# Patient Record
Sex: Female | Born: 1965 | Race: Black or African American | Hispanic: No | Marital: Single | Smoking: Former smoker
Health system: Southern US, Community
[De-identification: ages and names within clinical notes are randomized; demographics above are authoritative.]

## PROBLEM LIST (undated history)

## (undated) DIAGNOSIS — Z972 Presence of dental prosthetic device (complete) (partial): Secondary | ICD-10-CM

## (undated) DIAGNOSIS — M719 Bursopathy, unspecified: Secondary | ICD-10-CM

---

## 2012-03-14 HISTORY — PX: OTHER SURGICAL HISTORY: SHX169

## 2014-07-23 DIAGNOSIS — Z87891 Personal history of nicotine dependence: Secondary | ICD-10-CM | POA: Insufficient documentation

## 2014-08-13 DIAGNOSIS — K635 Polyp of colon: Secondary | ICD-10-CM | POA: Insufficient documentation

## 2019-10-03 ENCOUNTER — Ambulatory Visit (INDEPENDENT_AMBULATORY_CARE_PROVIDER_SITE_OTHER): Payer: 59 | Admitting: Family Medicine

## 2019-10-03 ENCOUNTER — Encounter: Payer: Self-pay | Admitting: Family Medicine

## 2019-10-03 ENCOUNTER — Other Ambulatory Visit: Payer: Self-pay

## 2019-10-03 VITALS — BP 108/76 | HR 79 | Ht 66.0 in | Wt 167.0 lb

## 2019-10-03 DIAGNOSIS — Z124 Encounter for screening for malignant neoplasm of cervix: Secondary | ICD-10-CM | POA: Diagnosis not present

## 2019-10-03 DIAGNOSIS — Z7689 Persons encountering health services in other specified circumstances: Secondary | ICD-10-CM | POA: Diagnosis not present

## 2019-10-03 NOTE — Progress Notes (Signed)
Date:  10/03/2019   Name:  Beverly Farrell   DOB:  17-Jul-1965   MRN:  [id number]   Chief Complaint: Establish Care  Patient is a 54 year old female who presents for a comprehensive physical exam. The patient reports the following problems: gyn. Health maintenance has been reviewed    No results found for: CREATININE, BUN, NA, K, CL, CO2 No results found for: CHOL, HDL, LDLCALC, LDLDIRECT, TRIG, CHOLHDL No results found for: TSH No results found for: HGBA1C No results found for: WBC, HGB, HCT, MCV, PLT No results found for: ALT, AST, GGT, ALKPHOS, BILITOT   Review of Systems  Constitutional: Negative.  Negative for chills, fatigue, fever and unexpected weight change.  HENT: Negative for congestion, ear discharge, ear pain, mouth sores, rhinorrhea, sinus pressure, sneezing and sore throat.   Eyes: Negative for photophobia, pain, discharge, redness and itching.  Respiratory: Negative for cough, shortness of breath, wheezing and stridor.   Cardiovascular: Negative for chest pain, palpitations and leg swelling.  Gastrointestinal: Negative for abdominal pain, blood in stool, constipation, diarrhea, nausea and vomiting.  Endocrine: Negative for cold intolerance, heat intolerance, polydipsia, polyphagia and polyuria.  Genitourinary: Negative for dysuria, flank pain, frequency, hematuria, menstrual problem, pelvic pain, urgency, vaginal bleeding and vaginal discharge.  Musculoskeletal: Negative for arthralgias, back pain and myalgias.  Skin: Negative for rash.  Allergic/Immunologic: Negative for environmental allergies and food allergies.  Neurological: Negative for dizziness, weakness, light-headedness, numbness and headaches.  Hematological: Negative for adenopathy. Does not bruise/bleed easily.  Psychiatric/Behavioral: Negative for dysphoric mood. The patient is not nervous/anxious.     There are no problems to display for this patient.   No Known Allergies  Past Surgical History:   Procedure Laterality Date  . polyp removal  2014   removed 3     Social History   Tobacco Use  . Smoking status: Never Smoker  . Smokeless tobacco: Never Used  Vaping Use  . Vaping Use: Never used  Substance Use Topics  . Alcohol use: Yes    Comment: occasionally  . Drug use: Yes    Types: Marijuana     Medication list has been reviewed and updated.  No outpatient medications have been marked as taking for the 10/03/19 encounter (Office Visit) with Juline Patch, MD.    [facility] 2/9 Scores 10/03/2019  PHQ - 2 Score 0  PHQ- 9 Score 0    GAD 7 : Generalized Anxiety Score 10/03/2019  Nervous, Anxious, on Edge 0  Control/stop worrying 0  Worry too much - different things 0  Trouble relaxing 0  Restless 0  Easily annoyed or irritable 0  Afraid - awful might happen 0  Total GAD 7 Score 0  Anxiety Difficulty Not difficult at all    BP Readings from Last 3 Encounters:  10/03/19 108/76    Physical Exam Vitals and nursing note reviewed.  Constitutional:      Appearance: She is well-developed.  HENT:     Head: Normocephalic.     Right Ear: Tympanic membrane, ear canal and external ear normal.     Left Ear: Tympanic membrane, ear canal and external ear normal.     Nose: Nose normal.     Mouth/Throat:     Mouth: Mucous membranes are moist.  Eyes:     General: Lids are everted, no foreign bodies appreciated. No scleral icterus.       Left eye: No foreign body or hordeolum.     Conjunctiva/sclera: Conjunctivae  normal.     Right eye: Right conjunctiva is not injected.     Left eye: Left conjunctiva is not injected.     Pupils: Pupils are equal, round, and reactive to light.  Neck:     Thyroid: No thyromegaly.     Vascular: No JVD.     Trachea: No tracheal deviation.  Cardiovascular:     Rate and Rhythm: Normal rate and regular rhythm.     Heart sounds: Normal heart sounds. No murmur heard.  No friction rub. No gallop.   Pulmonary:     Effort: Pulmonary effort is  normal. No respiratory distress.     Breath sounds: Normal breath sounds. No wheezing, rhonchi or rales.  Abdominal:     General: Bowel sounds are normal.     Palpations: Abdomen is soft. There is no mass.     Tenderness: There is no abdominal tenderness. There is no guarding or rebound.  Musculoskeletal:        General: No tenderness. Normal range of motion.     Cervical back: Normal range of motion and neck supple.  Lymphadenopathy:     Cervical: No cervical adenopathy.  Skin:    General: Skin is warm.     Findings: No rash.  Neurological:     Mental Status: She is alert and oriented to person, place, and time.     Cranial Nerves: No cranial nerve deficit.     Deep Tendon Reflexes: Reflexes normal.  Psychiatric:        Mood and Affect: Mood is not anxious or depressed.     Wt Readings from Last 3 Encounters:  10/03/19 167 lb (75.8 kg)    BP 108/76   Pulse 79   Ht 5\' 6"  (1.676 m)   Wt 167 lb (75.8 kg)   SpO2 97%   BMI 26.95 kg/m   Assessment and Plan: 1. Establishing care with new doctor, encounter for Patient establishes care with new physician.  Previous encounters were reviewed as were most recent labs, most recent imaging, and care everywhere.  Patient will be returning in 6 to 8 weeks health maintenance at which time we will do some lab work.  2. Encounter for screening for cervical cancer  Patient has a history of uterine polyps that have been [place] [facility].  Will refer to [facility] for cervical cancer maintenance and scheduling of her mammography. - Ambulatory referral to Obstetrics / Gynecology

## 2019-10-07 ENCOUNTER — Telehealth: Payer: Self-pay | Admitting: Obstetrics and Gynecology

## 2019-10-07 NOTE — Telephone Encounter (Signed)
[facility] referring for Encounter for screening for cervical cancer . Called and left voicemail for patient to call back to be scheduled.

## 2019-10-16 ENCOUNTER — Other Ambulatory Visit (HOSPITAL_COMMUNITY)
Admission: RE | Admit: 2019-10-16 | Discharge: 2019-10-16 | Disposition: A | Discharge status: Home / Self Care | Payer: 59 | Source: Ambulatory Visit | Attending: Advanced Practice Midwife | Admitting: Advanced Practice Midwife

## 2019-10-16 ENCOUNTER — Other Ambulatory Visit: Payer: Self-pay

## 2019-10-16 ENCOUNTER — Encounter: Payer: Self-pay | Admitting: Advanced Practice Midwife

## 2019-10-16 ENCOUNTER — Ambulatory Visit (INDEPENDENT_AMBULATORY_CARE_PROVIDER_SITE_OTHER): Payer: 59 | Admitting: Advanced Practice Midwife

## 2019-10-16 VITALS — BP 131/83 | HR 82 | Ht 66.0 in | Wt 167.0 lb

## 2019-10-16 DIAGNOSIS — Z1239 Encounter for other screening for malignant neoplasm of breast: Secondary | ICD-10-CM | POA: Diagnosis not present

## 2019-10-16 DIAGNOSIS — Z124 Encounter for screening for malignant neoplasm of cervix: Secondary | ICD-10-CM

## 2019-10-16 DIAGNOSIS — Z87898 Personal history of other specified conditions: Secondary | ICD-10-CM

## 2019-10-16 DIAGNOSIS — Z113 Encounter for screening for infections with a predominantly sexual mode of transmission: Secondary | ICD-10-CM

## 2019-10-16 DIAGNOSIS — Z1211 Encounter for screening for malignant neoplasm of colon: Secondary | ICD-10-CM

## 2019-10-16 NOTE — Progress Notes (Signed)
   Patient ID: Beverly Farrell, female   DOB: [birth year], 54 y.o.   MRN: [id number]  Reason for Consult: Gynecologic Exam (Referred by [facility] for PAP/breast exam)   Referred by Juline Patch, MD  Subjective:  HPI:  Beverly Farrell is a 54 y.o. female referred by [facility] for PAP smear and breast exam/referral for mammogram. Patient reports last PAP was in 2016 and was normal. She has a history of abnormal PAP years ago with cryo procedure. She denies any gyn concerns or breast concerns. She accepts STD testing and is overdue for a colonoscopy and would like a referral for repeat.   History reviewed. No pertinent past medical history. Family History  Problem Relation Age of Onset  . Hypertension Mother   . Diabetes Father   . Hyperlipidemia Father   . Hypertension Father    Past Surgical History:  Procedure Laterality Date  . polyp removal  2014   removed 3     Short Social History:  Social History   Tobacco Use  . Smoking status: Former Research scientist (life sciences)  . Smokeless tobacco: Never Used  Substance Use Topics  . Alcohol use: Yes    Comment: occasionally    No Known Allergies  No current outpatient medications on file.   No current facility-administered medications for this visit.    Review of Systems  Constitutional: Negative for chills and fever.  HENT: Negative for congestion, ear discharge, ear pain, hearing loss, sinus pain and sore throat.   Eyes: Negative for blurred vision and double vision.  Respiratory: Negative for cough, shortness of breath and wheezing.   Cardiovascular: Negative for chest pain, palpitations and leg swelling.  Gastrointestinal: Negative for abdominal pain, blood in stool, constipation, diarrhea, heartburn, melena, nausea and vomiting.  Genitourinary: Negative for dysuria, flank pain, frequency, hematuria and urgency.  Musculoskeletal: Negative for back pain, joint pain and myalgias.  Skin: Negative for itching and rash.  Neurological:  Negative for dizziness, tingling, tremors, sensory change, speech change, focal weakness, seizures, loss of consciousness, weakness and headaches.  Endo/Heme/Allergies: Negative for environmental allergies. Does not bruise/bleed easily.  Psychiatric/Behavioral: Negative for depression, hallucinations, memory loss, substance abuse and suicidal ideas. The patient is not nervous/anxious and does not have insomnia.         Objective:  Objective   Vitals:   10/16/19 1525  BP: 131/83  Pulse: 82  Weight: 167 lb (75.8 kg)  Height: 5\' 6"  (1.676 m)   Body mass index is 26.95 kg/m. Constitutional: Well nourished, well developed female in no acute distress.  HEENT: normal Skin: Warm and dry.  Breast: symmetrical, non-tender to palpation, no masses or lesions Respiratory:  Normal respiratory effort Neuro: DTRs 2+, Cranial nerves grossly intact Psych: Alert and Oriented x3. No memory deficits. Normal mood and affect.  MS: normal gait, normal bilateral lower extremity ROM/strength/stability.  Pelvic exam:  is not limited by body habitus EGBUS: within normal limits Vagina: within normal limits and with normal mucosa  Cervix: normal nullip appearance, no CMT, no bleeding   Assessment/Plan:    54 y.o. G0 P0 female PAP smear and breast exam   Cervical cancer screening Referral to [facility] for mammogram Referral to [facility] for colonoscopy   [facility] 10/16/2019, 4:25 PM

## 2019-10-17 LAB — HEPATITIS PANEL, ACUTE
Hep A IgM: NEGATIVE
Hep B C IgM: NEGATIVE
Hep C Virus Ab: <0.1 s/co ratio (ref 0.0–0.9)
Hepatitis B Surface Ag: NEGATIVE

## 2019-10-17 LAB — HIV ANTIBODY (ROUTINE TESTING W REFLEX): HIV Screen 4th Generation wRfx: NONREACTIVE

## 2019-10-17 LAB — RPR QUALITATIVE: RPR Ser Ql: NONREACTIVE

## 2019-10-18 ENCOUNTER — Telehealth (INDEPENDENT_AMBULATORY_CARE_PROVIDER_SITE_OTHER): Payer: Self-pay | Admitting: Gastroenterology

## 2019-10-18 ENCOUNTER — Other Ambulatory Visit: Payer: Self-pay

## 2019-10-18 DIAGNOSIS — Z1211 Encounter for screening for malignant neoplasm of colon: Secondary | ICD-10-CM

## 2019-10-18 LAB — CYTOLOGY - PAP
Chlamydia: NEGATIVE
Comment: NEGATIVE
Comment: NEGATIVE
Comment: NEGATIVE
Comment: NORMAL
Diagnosis: NEGATIVE
High risk HPV: NEGATIVE
Neisseria Gonorrhea: NEGATIVE
Trichomonas: NEGATIVE

## 2019-10-18 MED ORDER — NA SULFATE-K SULFATE-MG SULF 17.5-3.13-1.6 GM/177ML PO SOLN: 1.0000 | Freq: Once | ORAL | 0 refills | Status: AC

## 2019-10-18 NOTE — Progress Notes (Signed)
Gastroenterology Pre-Procedure Review  Request Date: Friday 11/08/19 Requesting Physician: Dr. Allen Norris  PATIENT REVIEW QUESTIONS: The patient responded to the following health history questions as indicated:    1. Are you having any GI issues? no 2. Do you have a personal history of Polyps? yes (patient states she had colonoscopy 5 years ago at [facility] with Dr. Bobbye Charleston polyps she said polyps were present) 3. Do you have a family history of Colon Cancer or Polyps? no 4. Diabetes Mellitus? no 5. Joint replacements in the past 12 months?no 6. Major health problems in the past 3 months?no 7. Any artificial heart valves, MVP, or defibrillator?no    MEDICATIONS & ALLERGIES:    Patient reports the following regarding taking any anticoagulation/antiplatelet therapy:   Plavix, Coumadin, Eliquis, Xarelto, Lovenox, Pradaxa, Brilinta, or Effient? no Aspirin? no  Patient confirms/reports the following medications:  No current outpatient medications on file.   No current facility-administered medications for this visit.    Patient confirms/reports the following allergies:  No Known Allergies  No orders of the defined types were placed in this encounter.   AUTHORIZATION INFORMATION Primary Insurance: 1D#: Group #:  Secondary Insurance: 1D#: Group #:  SCHEDULE INFORMATION: Date: Friday 08/27/21Time: Location:[facility]

## 2019-10-30 ENCOUNTER — Encounter: Payer: Self-pay | Admitting: Gastroenterology

## 2019-10-30 ENCOUNTER — Other Ambulatory Visit: Payer: Self-pay

## 2019-10-31 ENCOUNTER — Encounter: Payer: Self-pay | Admitting: Family Medicine

## 2019-10-31 ENCOUNTER — Other Ambulatory Visit: Payer: Self-pay

## 2019-10-31 ENCOUNTER — Ambulatory Visit (INDEPENDENT_AMBULATORY_CARE_PROVIDER_SITE_OTHER): Payer: 59 | Admitting: Family Medicine

## 2019-10-31 VITALS — BP 136/80 | HR 71 | Ht 66.0 in | Wt 165.0 lb

## 2019-10-31 DIAGNOSIS — Z0000 Encounter for general adult medical examination without abnormal findings: Secondary | ICD-10-CM | POA: Diagnosis not present

## 2019-10-31 DIAGNOSIS — Z8639 Personal history of other endocrine, nutritional and metabolic disease: Secondary | ICD-10-CM

## 2019-10-31 NOTE — Progress Notes (Signed)
Date:  10/31/2019   Name:  Beverly Farrell   DOB:  Sep 22, 1965   MRN:  [id number]   Chief Complaint: Hyperlipidemia (H/o of hyperlipidemia in her 76s. )  Patient is a 54 year old female who presents for a health maintenance exam. The patient reports the following problems: history of hyperlipid. Health maintenance has been reviewed up to date  Hyperlipidemia This is a chronic problem. The current episode started more than 1 year ago. The problem is controlled. Recent lipid tests were reviewed and are normal. She has no history of chronic renal disease, diabetes, hypothyroidism, liver disease, obesity or nephrotic syndrome. There are no known factors aggravating her hyperlipidemia. Pertinent negatives include no chest pain, focal sensory loss, focal weakness, leg pain, myalgias or shortness of breath. Current antihyperlipidemic treatment includes statins and diet change. The current treatment provides moderate improvement of lipids. There are no compliance problems.     No results found for: CREATININE, BUN, NA, K, CL, CO2 No results found for: CHOL, HDL, LDLCALC, LDLDIRECT, TRIG, CHOLHDL No results found for: TSH No results found for: HGBA1C No results found for: WBC, HGB, HCT, MCV, PLT No results found for: ALT, AST, GGT, ALKPHOS, BILITOT   Review of Systems  Constitutional: Negative.  Negative for chills, fatigue, fever and unexpected weight change.  HENT: Negative for congestion, ear discharge, ear pain, rhinorrhea, sinus pressure, sneezing and sore throat.   Eyes: Negative for photophobia, pain, discharge, redness and itching.  Respiratory: Negative for cough, shortness of breath, wheezing and stridor.   Cardiovascular: Negative for chest pain.  Gastrointestinal: Negative for abdominal pain, blood in stool, constipation, diarrhea, nausea and vomiting.  Endocrine: Negative for cold intolerance, heat intolerance, polydipsia, polyphagia and polyuria.  Genitourinary: Negative for  dysuria, flank pain, frequency, hematuria, menstrual problem, pelvic pain, urgency, vaginal bleeding and vaginal discharge.  Musculoskeletal: Negative for arthralgias, back pain and myalgias.  Skin: Negative for rash.  Allergic/Immunologic: Negative for environmental allergies and food allergies.  Neurological: Negative for dizziness, focal weakness, weakness, light-headedness, numbness and headaches.  Hematological: Negative for adenopathy. Does not bruise/bleed easily.  Psychiatric/Behavioral: Negative for dysphoric mood. The patient is not nervous/anxious.     Patient Active Problem List   Diagnosis Date Noted  . History of abnormal mammogram 10/16/2019  . Colon polyps 08/13/2014  . History of smoking 07/23/2014    No Known Allergies  Past Surgical History:  Procedure Laterality Date  . polyp removal  2014   removed 3     Social History   Tobacco Use  . Smoking status: Former Smoker    Packs/day: 1.00    Years: 1.00    Pack years: 1.00    Types: Cigarettes  . Smokeless tobacco: Never Used  Vaping Use  . Vaping Use: Never used  Substance Use Topics  . Alcohol use: Yes    Comment: occasionally  . Drug use: Yes    Frequency: 2.0 times per week    Types: Marijuana     Medication list has been reviewed and updated.  No outpatient medications have been marked as taking for the 10/31/19 encounter (Office Visit) with Juline Patch, MD.    [facility] 2/9 Scores 10/03/2019  PHQ - 2 Score 0  PHQ- 9 Score 0    GAD 7 : Generalized Anxiety Score 10/03/2019  Nervous, Anxious, on Edge 0  Control/stop worrying 0  Worry too much - different things 0  Trouble relaxing 0  Restless 0  Easily annoyed or irritable  0  Afraid - awful might happen 0  Total GAD 7 Score 0  Anxiety Difficulty Not difficult at all    BP Readings from Last 3 Encounters:  10/31/19 136/80  10/16/19 131/83  10/03/19 108/76    Physical Exam  Wt Readings from Last 3 Encounters:  10/31/19 165 lb  (74.8 kg)  10/16/19 167 lb (75.8 kg)  10/03/19 167 lb (75.8 kg)    BP 136/80   Pulse 71   Ht 5\' 6"  (1.676 m)   Wt 165 lb (74.8 kg)   SpO2 100%   BMI 26.63 kg/m   Assessment and Plan:  1. Healthcare maintenance No subjective objective concerns noted during history and physical exam.  Review of patient's chart notes that she has had a history of hyperlipidemia controlled with dietary approach in the past.  We will check renal function panel for glucose and electrolyte concerns and GFR as well as lipid panel.- Renal Function Panel - Lipid Panel With LDL/HDL Ratio  2. History of hyperlipidemia Chronic.  Controlled.  Stable.  We will check lipid panel. - Renal Function Panel - Lipid Panel With LDL/HDL Ratio

## 2019-11-01 LAB — LIPID PANEL WITH LDL/HDL RATIO
Cholesterol, Total: 242 mg/dL — ABNORMAL HIGH (ref 100–199)
HDL: 81 mg/dL (ref >39)
LDL Chol Calc (NIH): 149 mg/dL — ABNORMAL HIGH (ref 0–99)
LDL/HDL Ratio: 1.8 ratio (ref 0.0–3.2)
Triglycerides: 69 mg/dL (ref 0–149)
VLDL Cholesterol Cal: 12 mg/dL (ref 5–40)

## 2019-11-01 LAB — RENAL FUNCTION PANEL
Albumin: 4.5 g/dL (ref 3.8–4.9)
BUN/Creatinine Ratio: 21 (ref 9–23)
BUN: 14 mg/dL (ref 6–24)
CO2: 24 mmol/L (ref 20–29)
Calcium: 9.9 mg/dL (ref 8.7–10.2)
Chloride: 103 mmol/L (ref 96–106)
Creatinine, Ser: 0.66 mg/dL (ref 0.57–1.00)
GFR calc Af Amer: 115 mL/min/{1.73_m2} (ref >59)
GFR calc non Af Amer: 100 mL/min/{1.73_m2} (ref >59)
Glucose: 99 mg/dL (ref 65–99)
Phosphorus: 4.1 mg/dL (ref 3.0–4.3)
Potassium: 4.9 mmol/L (ref 3.5–5.2)
Sodium: 140 mmol/L (ref 134–144)

## 2019-11-06 ENCOUNTER — Other Ambulatory Visit: Payer: Self-pay

## 2019-11-06 ENCOUNTER — Other Ambulatory Visit
Admission: RE | Admit: 2019-11-06 | Discharge: 2019-11-06 | Disposition: A | Discharge status: Home / Self Care | Payer: 59 | Source: Ambulatory Visit | Attending: Gastroenterology | Admitting: Gastroenterology

## 2019-11-06 DIAGNOSIS — Z01812 Encounter for preprocedural laboratory examination: Secondary | ICD-10-CM | POA: Diagnosis present

## 2019-11-06 DIAGNOSIS — Z20822 Contact with and (suspected) exposure to covid-19: Secondary | ICD-10-CM | POA: Diagnosis not present

## 2019-11-06 NOTE — Discharge Instructions (Signed)
General Anesthesia, Adult, Care After This sheet gives you information about how to care for yourself after your procedure. Your health care provider may also give you more specific instructions. If you have problems or questions, contact your health care provider. What can I expect after the procedure? After the procedure, the following side effects are common:  Pain or discomfort at the IV site.  Nausea.  Vomiting.  Sore throat.  Trouble concentrating.  Feeling cold or chills.  Weak or tired.  Sleepiness and fatigue.  Soreness and body aches. These side effects can affect parts of the body that were not involved in surgery. Follow these instructions at home:  For at least 24 hours after the procedure:  Have a responsible adult stay with you. It is important to have someone help care for you until you are awake and alert.  Rest as needed.  Do not: ? Participate in activities in which you could fall or become injured. ? Drive. ? Use heavy machinery. ? Drink alcohol. ? Take sleeping pills or medicines that cause drowsiness. ? Make important decisions or sign legal documents. ? Take care of children on your own. Eating and drinking  Follow any instructions from your health care provider about eating or drinking restrictions.  When you feel hungry, start by eating small amounts of foods that are soft and easy to digest (bland), such as toast. Gradually return to your regular diet.  Drink enough fluid to keep your urine pale yellow.  If you vomit, rehydrate by drinking water, juice, or clear broth. General instructions  If you have sleep apnea, surgery and certain medicines can increase your risk for breathing problems. Follow instructions from your health care provider about wearing your sleep device: ? Anytime you are sleeping, including during daytime naps. ? While taking prescription pain medicines, sleeping medicines, or medicines that make you drowsy.  Return to  your normal activities as told by your health care provider. Ask your health care provider what activities are safe for you.  Take over-the-counter and prescription medicines only as told by your health care provider.  If you smoke, do not smoke without supervision.  Keep all follow-up visits as told by your health care provider. This is important. Contact a health care provider if:  You have nausea or vomiting that does not get better with medicine.  You cannot eat or drink without vomiting.  You have pain that does not get better with medicine.  You are unable to pass urine.  You develop a skin rash.  You have a fever.  You have redness around your IV site that gets worse. Get help right away if:  You have difficulty breathing.  You have chest pain.  You have blood in your urine or stool, or you vomit blood. Summary  After the procedure, it is common to have a sore throat or nausea. It is also common to feel tired.  Have a responsible adult stay with you for the first 24 hours after general anesthesia. It is important to have someone help care for you until you are awake and alert.  When you feel hungry, start by eating small amounts of foods that are soft and easy to digest (bland), such as toast. Gradually return to your regular diet.  Drink enough fluid to keep your urine pale yellow.  Return to your normal activities as told by your health care provider. Ask your health care provider what activities are safe for you. This information is not   intended to replace advice given to you by your health care provider. Make sure you discuss any questions you have with your health care provider. Document Revised: 03/03/2017 Document Reviewed: 10/14/2016 Elsevier Patient Education  2020 [employer]  

## 2019-11-07 LAB — SARS CORONAVIRUS 2 (TAT 6-24 HRS): SARS Coronavirus 2: NEGATIVE

## 2019-11-08 ENCOUNTER — Encounter: Payer: Self-pay | Admitting: Gastroenterology

## 2019-11-08 ENCOUNTER — Ambulatory Visit: Payer: 59 | Admitting: Anesthesiology

## 2019-11-08 ENCOUNTER — Ambulatory Visit
Admission: RE | Admit: 2019-11-08 | Discharge: 2019-11-08 | Disposition: A | Discharge status: Home / Self Care | Payer: 59 | Attending: Gastroenterology | Admitting: Gastroenterology

## 2019-11-08 ENCOUNTER — Encounter
Admission: RE | Disposition: A | Discharge status: Home / Self Care | Payer: Self-pay | Source: Home / Self Care | Attending: Gastroenterology

## 2019-11-08 ENCOUNTER — Other Ambulatory Visit: Payer: Self-pay

## 2019-11-08 DIAGNOSIS — K635 Polyp of colon: Secondary | ICD-10-CM

## 2019-11-08 DIAGNOSIS — Z87891 Personal history of nicotine dependence: Secondary | ICD-10-CM | POA: Insufficient documentation

## 2019-11-08 DIAGNOSIS — D124 Benign neoplasm of descending colon: Secondary | ICD-10-CM | POA: Diagnosis not present

## 2019-11-08 DIAGNOSIS — Z860100 Personal history of colon polyps, unspecified: Secondary | ICD-10-CM

## 2019-11-08 DIAGNOSIS — Z1211 Encounter for screening for malignant neoplasm of colon: Secondary | ICD-10-CM | POA: Diagnosis not present

## 2019-11-08 DIAGNOSIS — D123 Benign neoplasm of transverse colon: Secondary | ICD-10-CM | POA: Diagnosis not present

## 2019-11-08 DIAGNOSIS — K640 First degree hemorrhoids: Secondary | ICD-10-CM | POA: Insufficient documentation

## 2019-11-08 DIAGNOSIS — K5730 Diverticulosis of large intestine without perforation or abscess without bleeding: Secondary | ICD-10-CM | POA: Diagnosis not present

## 2019-11-08 HISTORY — PX: POLYPECTOMY: SHX5525

## 2019-11-08 HISTORY — DX: Presence of dental prosthetic device (complete) (partial): Z97.2

## 2019-11-08 HISTORY — DX: Bursopathy, unspecified: M71.9

## 2019-11-08 HISTORY — PX: COLONOSCOPY WITH PROPOFOL: SHX5780

## 2019-11-08 SURGERY — COLONOSCOPY WITH PROPOFOL
Anesthesia: General | Site: Rectum

## 2019-11-08 MED ORDER — PROPOFOL 10 MG/ML IV BOLUS
INTRAVENOUS | Status: DC | PRN
  Administered 2019-11-08: 120 mg via INTRAVENOUS
  Administered 2019-11-08: 40 mg via INTRAVENOUS
  Administered 2019-11-08: 20 mg via INTRAVENOUS
  Administered 2019-11-08: 10 mg via INTRAVENOUS
  Administered 2019-11-08 (×2): 20 mg via INTRAVENOUS

## 2019-11-08 MED ORDER — LIDOCAINE HCL (CARDIAC) PF 100 MG/5ML IV SOSY
PREFILLED_SYRINGE | INTRAVENOUS | Status: DC | PRN
  Administered 2019-11-08: 30 mg via INTRAVENOUS

## 2019-11-08 MED ORDER — STERILE WATER FOR IRRIGATION IR SOLN: Status: DC | PRN

## 2019-11-08 MED ORDER — LACTATED RINGERS IV SOLN: INTRAVENOUS | Status: DC

## 2019-11-08 MED ORDER — PHENYLEPHRINE HCL (PRESSORS) 10 MG/ML IV SOLN
INTRAVENOUS | Status: DC | PRN
  Administered 2019-11-08: 50 ug via INTRAVENOUS

## 2019-11-08 SURGICAL SUPPLY — 7 items
GOWN CVR UNV OPN BCK APRN NK (MISCELLANEOUS) ×2 IMPLANT
GOWN ISOL THUMB LOOP REG UNIV (MISCELLANEOUS) ×6
KIT ENDO PROCEDURE OLY (KITS) ×3 IMPLANT
MANIFOLD NEPTUNE II (INSTRUMENTS) ×3 IMPLANT
SNARE SHORT THROW 13M SML OVAL (MISCELLANEOUS) ×3 IMPLANT
TRAP ETRAP POLY (MISCELLANEOUS) ×3 IMPLANT
WATER STERILE IRR 250ML POUR (IV SOLUTION) ×3 IMPLANT

## 2019-11-08 NOTE — Anesthesia Preprocedure Evaluation (Signed)
Anesthesia Evaluation  Patient identified by MRN, date of birth, ID band Patient awake    Reviewed: Allergy & Precautions, NPO status , Patient's Chart, lab work & pertinent test results  Airway Mallampati: II  TM Distance: >3 FB Neck ROM: Full    Dental no notable dental hx.    Pulmonary neg pulmonary ROS, former smoker,    Pulmonary exam normal breath sounds clear to auscultation       Cardiovascular Exercise Tolerance: Good negative cardio ROS Normal cardiovascular exam Rhythm:Regular Rate:Normal     Neuro/Psych negative neurological ROS  negative psych ROS   GI/Hepatic negative GI ROS, Neg liver ROS,   Endo/Other  negative endocrine ROS  Renal/GU negative Renal ROS  negative genitourinary   Musculoskeletal negative musculoskeletal ROS (+)   Abdominal   Peds negative pediatric ROS (+)  Hematology negative hematology ROS (+)   Anesthesia Other Findings   Reproductive/Obstetrics negative OB ROS                             Anesthesia Physical Anesthesia Plan  ASA: II  Anesthesia Plan: General   Post-op Pain Management:    Induction: Intravenous  PONV Risk Score and Plan: 3 and Treatment may vary due to age or medical condition and Propofol infusion  Airway Management Planned: Nasal Cannula and Natural Airway  Additional Equipment:   Intra-op Plan:   Post-operative Plan:   Informed Consent: I have reviewed the patients History and Physical, chart, labs and discussed the procedure including the risks, benefits and alternatives for the proposed anesthesia with the patient or authorized representative who has indicated his/her understanding and acceptance.     Dental advisory given  Plan Discussed with: CRNA  Anesthesia Plan Comments:         Anesthesia Quick Evaluation  Patient Active Problem List   Diagnosis Date Noted  . History of abnormal mammogram  10/16/2019  . Colon polyps 08/13/2014  . History of smoking 07/23/2014    No flowsheet data found. BMP Latest Ref Rng & Units 10/31/2019  Glucose 65 - 99 mg/dL 99  BUN 6 - 24 mg/dL 14  Creatinine 0.57 - 1.00 mg/dL 0.66  BUN/Creat Ratio 9 - 23 21  Sodium 134 - 144 mmol/L 140  Potassium 3.5 - 5.2 mmol/L 4.9  Chloride 96 - 106 mmol/L 103  CO2 20 - 29 mmol/L 24  Calcium 8.7 - 10.2 mg/dL 9.9    Risks and benefits of anesthesia discussed at length, patient or surrogate demonstrates understanding. Appropriately NPO. Plan to proceed with anesthesia.  Champ Mungo, MD 11/08/19

## 2019-11-08 NOTE — Op Note (Signed)
[facility] Patient Name: Beverly Farrell Procedure Date: 11/08/2019 8:23 AM MRN: [id number] Account #: [id number] Date of Birth: 01/17/1966 Admit Type: Outpatient Age: 54 Room: [facility] OR ROOM 01 Gender: Female Note Status: Finalized Procedure:             Colonoscopy Indications:           High risk colon cancer surveillance: Personal history                         of colonic polyps Providers:             Lucilla Lame MD, MD Referring MD:          Juline Patch, MD (Referring MD) Medicines:             Propofol per Anesthesia Complications:         No immediate complications. Procedure:             Pre-Anesthesia Assessment:                        - Prior to the procedure, a History and Physical was                         performed, and patient medications and allergies were                         reviewed. The patient's tolerance of previous                         anesthesia was also reviewed. The risks and benefits                         of the procedure and the sedation options and risks                         were discussed with the patient. All questions were                         answered, and informed consent was obtained. Prior                         Anticoagulants: The patient has taken no previous                         anticoagulant or antiplatelet agents. ASA Grade                         Assessment: II - A patient with mild systemic disease.                         After reviewing the risks and benefits, the patient                         was deemed in satisfactory condition to undergo the                         procedure.  After obtaining informed consent, the colonoscope was                         passed under direct vision. Throughout the procedure,                         the patient's blood pressure, pulse, and oxygen                         saturations were monitored continuously. The was                          introduced through the anus and advanced to the the                         cecum, identified by appendiceal orifice and ileocecal                         valve. The colonoscopy was performed without                         difficulty. The patient tolerated the procedure well.                         The quality of the bowel preparation was excellent. Findings:      The perianal and digital rectal examinations were normal.      A 4 mm polyp was found in the transverse colon. The polyp was sessile.       The polyp was removed with a cold snare. Resection and retrieval were       complete.      A 4 mm polyp was found in the descending colon. The polyp was sessile.       The polyp was removed with a cold snare. Resection and retrieval were       complete.      Non-bleeding internal hemorrhoids were found during retroflexion. The       hemorrhoids were Grade I (internal hemorrhoids that do not prolapse).      A few small-mouthed diverticula were found in the entire colon. Impression:            - One 4 mm polyp in the transverse colon, removed with                         a cold snare. Resected and retrieved.                        - One 4 mm polyp in the descending colon, removed with                         a cold snare. Resected and retrieved.                        - Non-bleeding internal hemorrhoids.                        - Diverticulosis in the entire examined colon. Recommendation:        - Discharge patient to home.                        -  Resume previous diet.                        - Continue present medications.                        - Await pathology results.                        - Repeat colonoscopy in 7 years for surveillance. Procedure Code(s):     --- Professional ---                        [id number], Colonoscopy, flexible; with removal of                         tumor(s), polyp(s), or other lesion(s) by snare                         technique Diagnosis  Code(s):     --- Professional ---                        Z86.010, Personal history of colonic polyps                        K63.5, Polyp of colon CPT copyright 2019 American Medical Association. All rights reserved. The codes documented in this report are preliminary and upon coder review may  be revised to meet current compliance requirements. Lucilla Lame MD, MD 11/08/2019 8:48:31 AM This report has been signed electronically. Number of Addenda: 0 Note Initiated On: 11/08/2019 8:23 AM Scope Withdrawal Time: 0 hours 7 minutes 0 seconds  Total Procedure Duration: 0 hours 11 minutes 15 seconds  Estimated Blood Loss:  Estimated blood loss: none.      [facility]

## 2019-11-08 NOTE — Anesthesia Procedure Notes (Signed)
Date/Time: 11/08/2019 8:28 AM Performed by: Cameron Ali, CRNA Pre-anesthesia Checklist: Patient identified, Emergency Drugs available, Suction available, Timeout performed and Patient being monitored Patient Re-evaluated:Patient Re-evaluated prior to induction Oxygen Delivery Method: Nasal cannula Placement Confirmation: positive ETCO2

## 2019-11-08 NOTE — Anesthesia Postprocedure Evaluation (Signed)
Anesthesia Post Note  Patient: Beverly Farrell  Procedure(s) Performed: COLONOSCOPY WITH BIOPSY (N/A Rectum) POLYPECTOMY (N/A Rectum)     Patient location during evaluation: PACU Anesthesia Type: General Level of consciousness: awake and alert Pain management: pain level controlled Vital Signs Assessment: post-procedure vital signs reviewed and stable Respiratory status: spontaneous breathing, nonlabored ventilation, respiratory function stable and patient connected to nasal cannula oxygen Cardiovascular status: stable and blood pressure returned to baseline Postop Assessment: no apparent nausea or vomiting Anesthetic complications: no   No complications documented.  Sinda Du

## 2019-11-08 NOTE — Transfer of Care (Signed)
Immediate Anesthesia Transfer of Care Note  Patient: Beverly Farrell  Procedure(s) Performed: COLONOSCOPY WITH BIOPSY (N/A Rectum) POLYPECTOMY (N/A Rectum)  Patient Location: PACU  Anesthesia Type: General  Level of Consciousness: awake, alert  and patient cooperative  Airway and Oxygen Therapy: Patient Spontanous Breathing and Patient connected to supplemental oxygen  Post-op Assessment: Post-op Vital signs reviewed, Patient's Cardiovascular Status Stable, Respiratory Function Stable, Patent Airway and No signs of Nausea or vomiting  Post-op Vital Signs: Reviewed and stable  Complications: No complications documented.

## 2019-11-08 NOTE — H&P (Signed)
Lucilla Lame, MD [address] [place], [place] [address] [phone] Fax : [phone]  Primary Care Physician:  Juline Patch, MD Primary Gastroenterologist:  Dr. Allen Norris  Pre-Procedure History & Physical: HPI:  Beverly Farrell is a 54 y.o. female is here for an colonoscopy.   Past Medical History:  Diagnosis Date  . Bursitis    right hip  . Wears dentures    partial upper    Past Surgical History:  Procedure Laterality Date  . polyp removal  2014   removed 3     Prior to Admission medications   Not on File    Allergies as of 10/18/2019  . (No Known Allergies)    Family History  Problem Relation Age of Onset  . Hypertension Mother   . Diabetes Father   . Hyperlipidemia Father   . Hypertension Father     Social History   Socioeconomic History  . Marital status: Single    Spouse name: Not on file  . Number of children: Not on file  . Years of education: Not on file  . Highest education level: Not on file  Occupational History  . Not on file  Tobacco Use  . Smoking status: Former Smoker    Packs/day: 1.00    Years: 1.00    Pack years: 1.00    Types: Cigarettes  . Smokeless tobacco: Never Used  Vaping Use  . Vaping Use: Never used  Substance and Sexual Activity  . Alcohol use: Yes    Comment: occasionally  . Drug use: Yes    Frequency: 2.0 times per week    Types: Marijuana  . Sexual activity: Yes  Other Topics Concern  . Not on file  Social History Narrative  . Not on file   Social Determinants of Health   Financial Resource Strain:   . Difficulty of Paying Living Expenses: Not on file  Food Insecurity:   . Worried About Charity fundraiser in the Last Year: Not on file  . Ran Out of Food in the Last Year: Not on file  Transportation Needs:   . Lack of Transportation (Medical): Not on file  . Lack of Transportation (Non-Medical): Not on file  Physical Activity:   . Days of Exercise per Week: Not on file  .  Minutes of Exercise per Session: Not on file  Stress:   . Feeling of Stress : Not on file  Social Connections:   . Frequency of Communication with Friends and Family: Not on file  . Frequency of Social Gatherings with Friends and Family: Not on file  . Attends Religious Services: Not on file  . Active Member of Clubs or Organizations: Not on file  . Attends Archivist Meetings: Not on file  . Marital Status: Not on file  Intimate Partner Violence:   . Fear of Current or Ex-Partner: Not on file  . Emotionally Abused: Not on file  . Physically Abused: Not on file  . Sexually Abused: Not on file    Review of Systems: See HPI, otherwise negative ROS  Physical Exam: Ht 5\' 6"  (1.676 m)   Wt 74.8 kg   BMI 26.63 kg/m  General:   Alert,  pleasant and cooperative in NAD Head:  Normocephalic and atraumatic. Neck:  Supple; no masses or thyromegaly. Lungs:  Clear throughout to auscultation.    Heart:  Regular rate and rhythm. Abdomen:  Soft, nontender and nondistended. Normal bowel sounds, without guarding, and without  rebound.   Neurologic:  Alert and  oriented x4;  grossly normal neurologically.  Impression/Plan: [name] is here for an colonoscopy to be performed for a history of adenomatous polyps on 08/13/14    Risks, benefits, limitations, and alternatives regarding  colonoscopy have been reviewed with the patient.  Questions have been answered.  All parties agreeable.   Lucilla Lame, MD  11/08/2019, 7:49 AM

## 2019-11-11 ENCOUNTER — Encounter: Payer: Self-pay | Admitting: Gastroenterology

## 2019-11-11 LAB — SURGICAL PATHOLOGY

## 2019-11-13 ENCOUNTER — Encounter: Payer: Self-pay | Admitting: Gastroenterology

## 2020-02-19 ENCOUNTER — Ambulatory Visit
Admission: RE | Admit: 2020-02-19 | Discharge: 2020-02-19 | Disposition: A | Discharge status: Home / Self Care | Payer: 59 | Source: Ambulatory Visit | Attending: Advanced Practice Midwife | Admitting: Advanced Practice Midwife

## 2020-02-19 ENCOUNTER — Other Ambulatory Visit: Payer: Self-pay

## 2020-02-19 DIAGNOSIS — Z1231 Encounter for screening mammogram for malignant neoplasm of breast: Secondary | ICD-10-CM | POA: Insufficient documentation

## 2020-02-19 DIAGNOSIS — Z1239 Encounter for other screening for malignant neoplasm of breast: Secondary | ICD-10-CM

## 2020-02-25 ENCOUNTER — Other Ambulatory Visit: Payer: Self-pay | Admitting: *Deleted

## 2020-02-25 ENCOUNTER — Inpatient Hospital Stay
Admission: RE | Admit: 2020-02-25 | Discharge: 2020-02-25 | Disposition: A | Discharge status: Home / Self Care | Payer: Self-pay | Source: Ambulatory Visit | Attending: *Deleted | Admitting: *Deleted

## 2020-02-25 DIAGNOSIS — Z1231 Encounter for screening mammogram for malignant neoplasm of breast: Secondary | ICD-10-CM

## 2020-03-11 ENCOUNTER — Ambulatory Visit
Admission: EM | Admit: 2020-03-11 | Discharge: 2020-03-11 | Disposition: A | Discharge status: Home / Self Care | Payer: 59 | Attending: Family Medicine | Admitting: Family Medicine

## 2020-03-11 ENCOUNTER — Other Ambulatory Visit: Payer: Self-pay

## 2020-03-11 DIAGNOSIS — R059 Cough, unspecified: Secondary | ICD-10-CM

## 2020-03-11 DIAGNOSIS — R051 Acute cough: Secondary | ICD-10-CM | POA: Diagnosis not present

## 2020-03-11 DIAGNOSIS — Z20822 Contact with and (suspected) exposure to covid-19: Secondary | ICD-10-CM | POA: Insufficient documentation

## 2020-03-11 DIAGNOSIS — J029 Acute pharyngitis, unspecified: Secondary | ICD-10-CM | POA: Insufficient documentation

## 2020-03-11 DIAGNOSIS — J069 Acute upper respiratory infection, unspecified: Secondary | ICD-10-CM | POA: Diagnosis not present

## 2020-03-11 DIAGNOSIS — Z87891 Personal history of nicotine dependence: Secondary | ICD-10-CM | POA: Diagnosis not present

## 2020-03-11 DIAGNOSIS — Z86010 Personal history of colonic polyps: Secondary | ICD-10-CM | POA: Insufficient documentation

## 2020-03-11 LAB — RESP PANEL BY RT-PCR (FLU A&B, COVID) ARPGX2
Influenza A by PCR: NEGATIVE
Influenza B by PCR: NEGATIVE
SARS Coronavirus 2 by RT PCR: NEGATIVE

## 2020-03-11 LAB — GROUP A STREP BY PCR: Group A Strep by PCR: NOT DETECTED

## 2020-03-11 NOTE — Discharge Instructions (Signed)
Your flu and Covid tests are negative.  I will call you with the results of your strep test only if it is positive.  If your strep is negative then you likely have a cold.  If it is positive then I will send antibiotics for you.  URI/COLD SYMPTOMS: Your exam today is consistent with a viral illness. Use medications as directed, including cough syrup, nasal saline, and decongestants. Your symptoms should improve over the next few days and resolve within 7-10 days. Increase rest and fluids. F/u if symptoms worsen or predominate such as sore throat, ear pain, productive cough, shortness of breath, or if you develop high fevers or worsening fatigue over the next several days.

## 2020-03-11 NOTE — ED Provider Notes (Signed)
[facility]    CSN: [id number] Arrival date & time: 03/11/20  1043      History   Chief Complaint Chief Complaint  Patient presents with  . Cough    HPI Beverly Farrell is a 54 y.o. female   presenting for 2-day history of cough, sore throat, and body aches.  Patient is not vaccinated for COVID-19.  Denies any known Covid exposure.  Patient denies any fevers, weakness, ear pain, sinus pain, chest pain, breathing difficulty, abdominal pain, N/V/D, or change in smell or taste.  Patient states that she has had a few coworkers out sick with "colds."  Has been taking Alka-Seltzer cold plus with some improvement in symptoms.  Patient has no other complaints or concerns.  HPI  Past Medical History:  Diagnosis Date  . Bursitis    right hip  . Wears dentures    partial upper    Patient Active Problem List   Diagnosis Date Noted  . Personal history of colonic polyps   . History of abnormal mammogram 10/16/2019  . Colon polyps 08/13/2014  . History of smoking 07/23/2014    Past Surgical History:  Procedure Laterality Date  . COLONOSCOPY WITH PROPOFOL N/A 11/08/2019   Procedure: COLONOSCOPY WITH BIOPSY;  Surgeon: Midge Minium, MD;  Location: [facility];  Service: Endoscopy;  Laterality: N/A;  priority 4  . polyp removal  2014   removed 3   . POLYPECTOMY N/A 11/08/2019   Procedure: POLYPECTOMY;  Surgeon: Midge Minium, MD;  Location: [facility];  Service: Endoscopy;  Laterality: N/A;    OB History    Gravida  0   Para  0   Term  0   Preterm  0   AB  0   Living  0     SAB  0   IAB  0   Ectopic  0   Multiple  0   Live Births  0            Home Medications    Prior to Admission medications   Not on File    Family History Family History  Problem Relation Age of Onset  . Hypertension Mother   . Diabetes Father   . Hyperlipidemia Father   . Hypertension Father   . Breast cancer Maternal Aunt        mat great aunt  .  Breast cancer Maternal Grandmother        mat great gm    Social History Social History   Tobacco Use  . Smoking status: Former Smoker    Packs/day: 1.00    Years: 1.00    Pack years: 1.00    Types: Cigarettes  . Smokeless tobacco: Never Used  Vaping Use  . Vaping Use: Never used  Substance Use Topics  . Alcohol use: Yes    Comment: occasionally  . Drug use: Not Currently    Frequency: 2.0 times per week    Types: Marijuana     Allergies   Patient has no known allergies.   Review of Systems Review of Systems  Constitutional: Negative for chills, diaphoresis, fatigue and fever.  HENT: Positive for sore throat. Negative for congestion, ear pain, rhinorrhea, sinus pressure and sinus pain.   Respiratory: Positive for cough. Negative for shortness of breath.   Gastrointestinal: Positive for diarrhea. Negative for abdominal pain, nausea and vomiting.  Musculoskeletal: Negative for arthralgias and myalgias.  Skin: Negative for rash.  Neurological: Negative for weakness and headaches.  Hematological: Negative for adenopathy.     Physical Exam Triage Vital Signs ED Triage Vitals  Enc Vitals Group     BP 03/11/20 1301 (!) 148/88     Pulse Rate 03/11/20 1301 68     Resp 03/11/20 1301 18     Temp 03/11/20 1301 98.4 F (36.9 C)     Temp Source 03/11/20 1301 Oral     SpO2 03/11/20 1301 100 %     Weight --      Height --      Head Circumference --      Peak Flow --      Pain Score 03/11/20 1153 2     Pain Loc --      Pain Edu? --      Excl. in [place]? --    No data found.  Updated Vital Signs BP (!) 148/88 (BP Location: Left Arm)   Pulse 68   Temp 98.4 F (36.9 C) (Oral)   Resp 18   SpO2 100%       Physical Exam Vitals and nursing note reviewed.  Constitutional:      General: She is not in acute distress.    Appearance: Normal appearance. She is not ill-appearing or toxic-appearing.  HENT:     Head: Normocephalic and atraumatic.     Nose: Congestion and  rhinorrhea present.     Mouth/Throat:     Mouth: Mucous membranes are moist.     Pharynx: Oropharynx is clear. Posterior oropharyngeal erythema present.  Eyes:     General: No scleral icterus.       Right eye: No discharge.        Left eye: No discharge.     Conjunctiva/sclera: Conjunctivae normal.  Cardiovascular:     Rate and Rhythm: Normal rate and regular rhythm.     Heart sounds: Normal heart sounds.  Pulmonary:     Effort: Pulmonary effort is normal. No respiratory distress.     Breath sounds: Normal breath sounds.  Musculoskeletal:     Cervical back: Neck supple.  Skin:    General: Skin is dry.  Neurological:     General: No focal deficit present.     Mental Status: She is alert. Mental status is at baseline.     Motor: No weakness.     Gait: Gait normal.  Psychiatric:        Mood and Affect: Mood normal.        Behavior: Behavior normal.        Thought Content: Thought content normal.      UC Treatments / Results  Labs (all labs ordered are listed, but only abnormal results are displayed) Labs Reviewed  RESP PANEL BY RT-PCR (FLU A&B, COVID) ARPGX2  GROUP A STREP BY PCR    EKG   Radiology No results found.  Procedures Procedures (including critical care time)  Medications Ordered in UC Medications - No data to display  Initial Impression / Assessment and Plan / UC Course  I have reviewed the triage vital signs and the nursing notes.  Pertinent labs & imaging results that were available during my care of the patient were reviewed by me and considered in my medical decision making (see chart for details).   Respiratory panel and molecular strep test negative.  Suspect viral illness.  Advised OTC decongestants, rest and fluids.  Advised that she should self of the neck something days.  Advised follow-up with [place] as needed for any new or worsening  symptoms.  ED precautions reviewed with patient.    Final Clinical Impressions(s) / UC Diagnoses   Final  diagnoses:  Acute upper respiratory infection  Cough  Sore throat     Discharge Instructions     Your flu and Covid tests are negative.  I will call you with the results of your strep test only if it is positive.  If your strep is negative then you likely have a cold.  If it is positive then I will send antibiotics for you.  URI/COLD SYMPTOMS: Your exam today is consistent with a viral illness. Use medications as directed, including cough syrup, nasal saline, and decongestants. Your symptoms should improve over the next few days and resolve within 7-10 days. Increase rest and fluids. F/u if symptoms worsen or predominate such as sore throat, ear pain, productive cough, shortness of breath, or if you develop high fevers or worsening fatigue over the next several days.      ED Prescriptions    None     PDMP not reviewed this encounter.   Danton Clap, PA-C 03/11/20 1622

## 2020-03-11 NOTE — ED Triage Notes (Signed)
Cough, sore throat, body aches and some diarrhea x 2 days.  Took Alka Seltzer Cold Plus with some relief.

## 2020-04-22 ENCOUNTER — Encounter: Payer: Self-pay | Admitting: Family Medicine

## 2020-05-04 ENCOUNTER — Encounter: Payer: Self-pay | Admitting: Family Medicine

## 2020-05-07 ENCOUNTER — Ambulatory Visit: Payer: Medicaid Other | Admitting: Family Medicine

## 2020-06-15 ENCOUNTER — Ambulatory Visit (INDEPENDENT_AMBULATORY_CARE_PROVIDER_SITE_OTHER): Payer: Self-pay | Admitting: Family Medicine

## 2020-06-15 ENCOUNTER — Other Ambulatory Visit: Payer: Self-pay

## 2020-06-15 ENCOUNTER — Encounter: Payer: Self-pay | Admitting: Family Medicine

## 2020-06-15 VITALS — BP 138/80 | HR 80 | Ht 66.0 in | Wt 166.0 lb

## 2020-06-15 DIAGNOSIS — Z0000 Encounter for general adult medical examination without abnormal findings: Secondary | ICD-10-CM

## 2020-06-15 DIAGNOSIS — Z8639 Personal history of other endocrine, nutritional and metabolic disease: Secondary | ICD-10-CM

## 2020-06-15 NOTE — Progress Notes (Signed)
Date:  06/15/2020   Name:  Beverly Farrell   DOB:  01-21-1966   MRN:  [id number]   Chief Complaint: Employment Physical (No immunizations needed)  Patient is a 55 year old female who presents for health maintenance exam. The patient reports the following problems: none. Health maintenance has been reviewed up to date   Lab Results  Component Value Date   CREATININE 0.66 10/31/2019   BUN 14 10/31/2019   NA 140 10/31/2019   K 4.9 10/31/2019   CL 103 10/31/2019   CO2 24 10/31/2019   Lab Results  Component Value Date   CHOL 242 (H) 10/31/2019   HDL 81 10/31/2019   LDLCALC 149 (H) 10/31/2019   TRIG 69 10/31/2019   No results found for: TSH No results found for: HGBA1C No results found for: WBC, HGB, HCT, MCV, PLT No results found for: ALT, AST, GGT, ALKPHOS, BILITOT   Review of Systems  Constitutional: Negative.  Negative for chills, fatigue, fever and unexpected weight change.  HENT: Negative for congestion, ear discharge, ear pain, rhinorrhea, sinus pressure, sneezing and sore throat.   Eyes: Negative for photophobia, pain, discharge, redness and itching.  Respiratory: Negative for cough, shortness of breath, wheezing and stridor.   Gastrointestinal: Negative for abdominal pain, blood in stool, constipation, diarrhea, nausea and vomiting.  Endocrine: Negative for cold intolerance, heat intolerance, polydipsia, polyphagia and polyuria.  Genitourinary: Negative for dysuria, flank pain, frequency, hematuria, menstrual problem, pelvic pain, urgency, vaginal bleeding and vaginal discharge.  Musculoskeletal: Negative for arthralgias, back pain and myalgias.  Skin: Negative for rash.  Allergic/Immunologic: Negative for environmental allergies and food allergies.  Neurological: Negative for dizziness, weakness, light-headedness, numbness and headaches.  Hematological: Negative for adenopathy. Does not bruise/bleed easily.  Psychiatric/Behavioral: Negative for dysphoric mood. The  patient is not nervous/anxious.     Patient Active Problem List   Diagnosis Date Noted  . Personal history of colonic polyps   . History of abnormal mammogram 10/16/2019  . Colon polyps 08/13/2014  . History of smoking 07/23/2014    No Known Allergies  Past Surgical History:  Procedure Laterality Date  . COLONOSCOPY WITH PROPOFOL N/A 11/08/2019   Procedure: COLONOSCOPY WITH BIOPSY;  Surgeon: Lucilla Lame, MD;  Location: [place];  Service: Endoscopy;  Laterality: N/A;  priority 4  . polyp removal  2014   removed 3   . POLYPECTOMY N/A 11/08/2019   Procedure: POLYPECTOMY;  Surgeon: Lucilla Lame, MD;  Location: [place];  Service: Endoscopy;  Laterality: N/A;    Social History   Tobacco Use  . Smoking status: Former Smoker    Packs/day: 1.00    Years: 1.00    Pack years: 1.00    Types: Cigarettes  . Smokeless tobacco: Never Used  Vaping Use  . Vaping Use: Never used  Substance Use Topics  . Alcohol use: Yes    Comment: occasionally  . Drug use: Not Currently    Frequency: 2.0 times per week     Medication list has been reviewed and updated.  No outpatient medications have been marked as taking for the 06/15/20 encounter (Office Visit) with Juline Patch, MD.    [facility] 2/9 Scores 10/03/2019  PHQ - 2 Score 0  PHQ- 9 Score 0    GAD 7 : Generalized Anxiety Score 10/03/2019  Nervous, Anxious, on Edge 0  Control/stop worrying 0  Worry too much - different things 0  Trouble relaxing 0  Restless 0  Easily annoyed  or irritable 0  Afraid - awful might happen 0  Total GAD 7 Score 0  Anxiety Difficulty Not difficult at all    BP Readings from Last 3 Encounters:  06/15/20 138/80  03/11/20 (!) 148/88  11/08/19 92/69    Physical Exam Vitals and nursing note reviewed.  Constitutional:      Appearance: Normal appearance. She is well-developed and normal weight.  HENT:     Head: Normocephalic.     Jaw: There is normal jaw occlusion.     Right  Ear: Hearing, tympanic membrane, ear canal and external ear normal.     Left Ear: Hearing, tympanic membrane, ear canal and external ear normal.     Nose: Nose normal.     Mouth/Throat:     Lips: Pink.     Mouth: Mucous membranes are moist.     Dentition: Normal dentition.     Pharynx: Oropharynx is clear. Uvula midline.  Eyes:     General: Lids are normal. Vision grossly intact. Gaze aligned appropriately. No scleral icterus.       Left eye: No foreign body or hordeolum.     Conjunctiva/sclera: Conjunctivae normal.     Right eye: Right conjunctiva is not injected.     Left eye: Left conjunctiva is not injected.     Pupils: Pupils are equal, round, and reactive to light.     Funduscopic exam:    Right eye: Red reflex present.        Left eye: Red reflex present. Neck:     Thyroid: No thyroid mass, thyromegaly or thyroid tenderness.     Vascular: Normal carotid pulses. No carotid bruit, hepatojugular reflux or JVD.     Trachea: Trachea and phonation normal. No tracheal deviation.  Cardiovascular:     Rate and Rhythm: Normal rate and regular rhythm.     Pulses: Normal pulses.          Carotid pulses are 2+ on the right side and 2+ on the left side.      Radial pulses are 2+ on the right side and 2+ on the left side.       Femoral pulses are 2+ on the right side and 2+ on the left side.      Popliteal pulses are 2+ on the right side and 2+ on the left side.       Dorsalis pedis pulses are 2+ on the right side and 2+ on the left side.       Posterior tibial pulses are 2+ on the right side and 2+ on the left side.     Heart sounds: Normal heart sounds. No murmur heard. No friction rub. No gallop. No S3 or S4 sounds.   Pulmonary:     Effort: Pulmonary effort is normal. No respiratory distress.     Breath sounds: Normal breath sounds. No decreased breath sounds, wheezing, rhonchi or rales.  Chest:  Breasts:     Right: Normal. No axillary adenopathy or supraclavicular adenopathy.      Left: Normal. No axillary adenopathy or supraclavicular adenopathy.    Abdominal:     General: Bowel sounds are normal.     Palpations: Abdomen is soft. There is no hepatomegaly, splenomegaly or mass.     Tenderness: There is no abdominal tenderness. There is no guarding or rebound.  Musculoskeletal:        General: No tenderness. Normal range of motion.     Cervical back: Full passive range of motion without  pain, normal range of motion and neck supple.  Lymphadenopathy:     Head:     Right side of head: No submental, submandibular or tonsillar adenopathy.     Left side of head: No submental, submandibular or tonsillar adenopathy.     Cervical: No cervical adenopathy.     Right cervical: No superficial, deep or posterior cervical adenopathy.    Left cervical: No superficial, deep or posterior cervical adenopathy.     Upper Body:     Right upper body: No supraclavicular or axillary adenopathy.     Left upper body: No supraclavicular or axillary adenopathy.  Skin:    General: Skin is warm.     Capillary Refill: Capillary refill takes less than 2 seconds.     Findings: No abrasion or rash.  Neurological:     Mental Status: She is alert and oriented to person, place, and time.     Cranial Nerves: Cranial nerves are intact. No cranial nerve deficit.     Sensory: Sensation is intact.     Motor: Motor function is intact.     Deep Tendon Reflexes: Reflexes normal.  Psychiatric:        Mood and Affect: Mood is not anxious or depressed.     Wt Readings from Last 3 Encounters:  06/15/20 166 lb (75.3 kg)  11/08/19 161 lb (73 kg)  10/31/19 165 lb (74.8 kg)    BP 138/80   Pulse 80   Ht 5\' 6"  (1.676 m)   Wt 166 lb (75.3 kg)   BMI 26.79 kg/m   Assessment and Plan: 1. History of hyperlipidemia Patient with history of hyperlipidemia for which we will at this time check a lipid panel for evaluation.  2. Healthcare maintenance Patient with history of health maintenance concerns.   Patient underwent following evaluation.Suzetta Timko is a 55 y.o. female who presents today for her Complete Annual Exam. She feels well. She reports exercising . She reports she is sleeping well. Immunizations are reviewed and recommendations provided.   Age appropriate screening tests are discussed. Counseling given for risk factor reduction interventions.  Patient's form was filled out for employment physical health and no contraindications were noted on physical exam or history.

## 2020-12-11 ENCOUNTER — Encounter: Payer: Self-pay | Admitting: Family Medicine

## 2021-03-31 ENCOUNTER — Encounter: Payer: Self-pay | Admitting: Family Medicine

## 2021-12-19 IMAGING — MG DIGITAL SCREENING BILAT W/ TOMO W/ CAD
8 series · 8 of 24 positions shown · non-contrast
Comparison: Previous exam(s).

CLINICAL DATA: Screening.

EXAM:
DIGITAL SCREENING BILATERAL MAMMOGRAM WITH TOMO AND CAD

[R CC synth-2D]
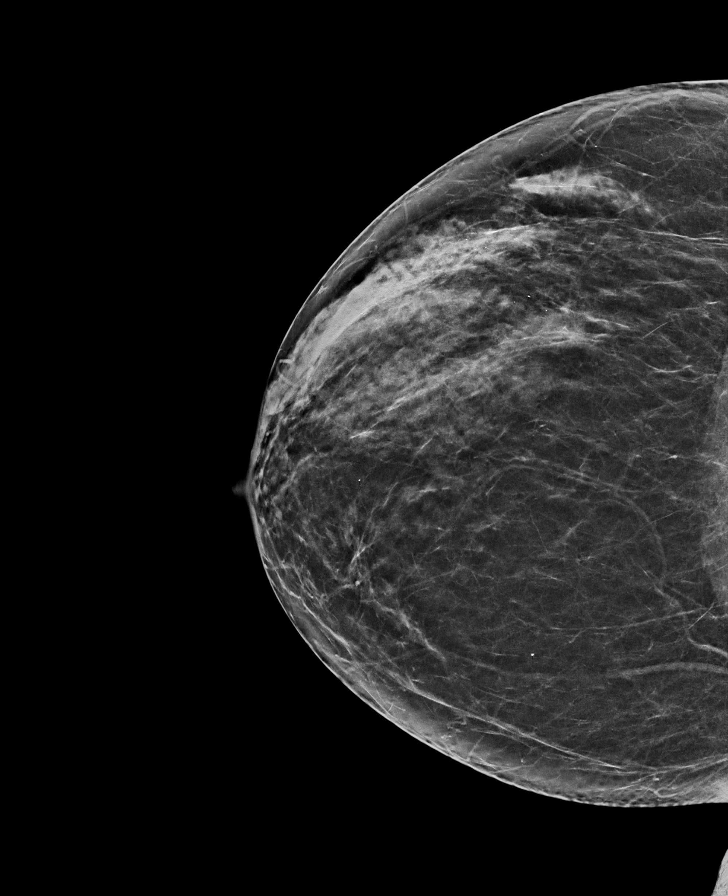

[L MLO synth-2D]
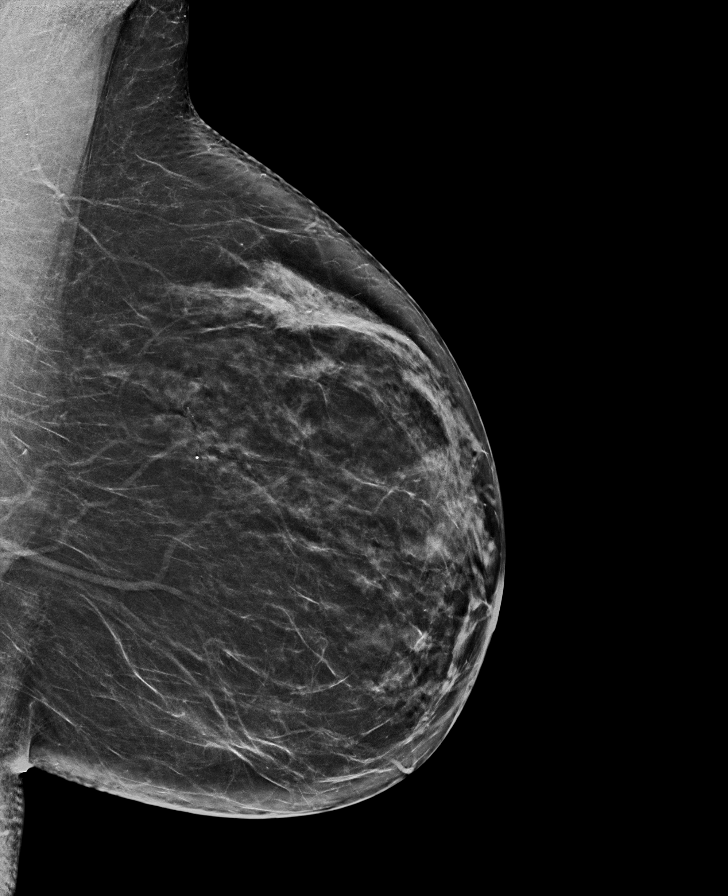

[L CC synth-2D]
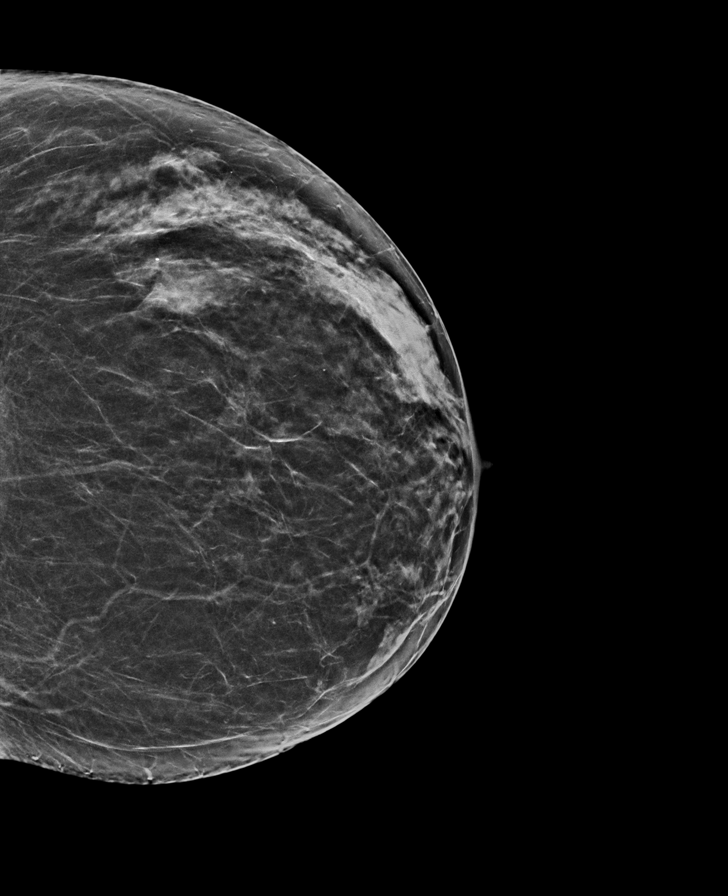

[R MLO synth-2D]
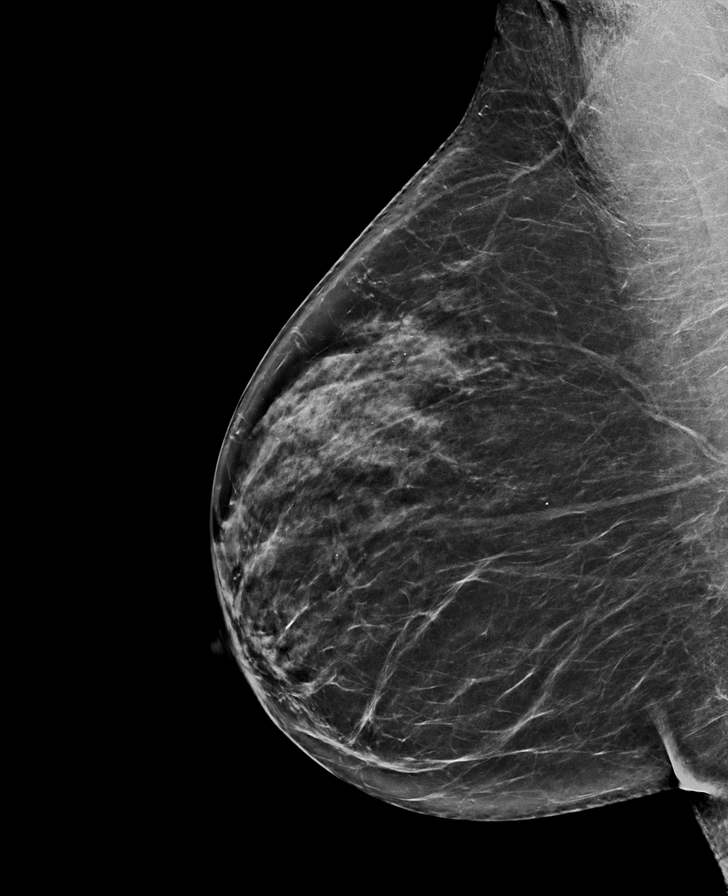

[L CC tomo · tomo slice 33/65.0]
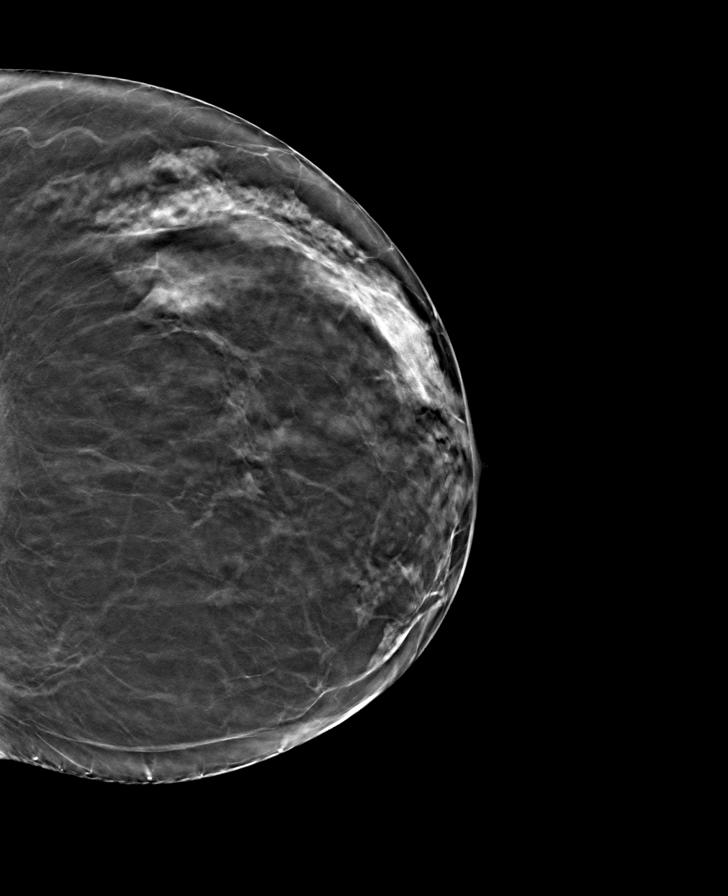

[L MLO tomo · tomo slice 36/71.0]
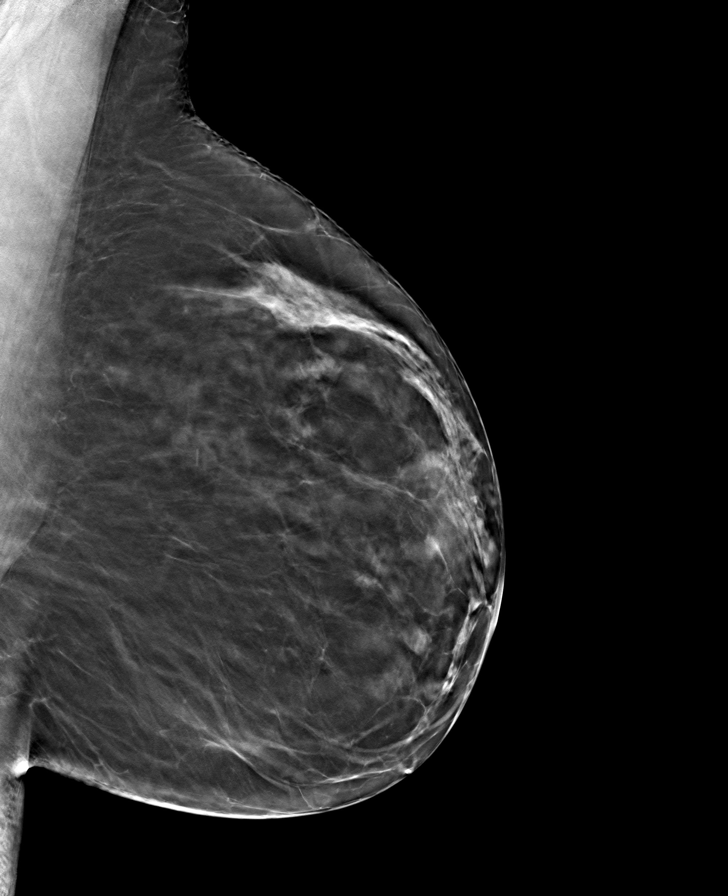

[R CC tomo · tomo slice 33/64.0]
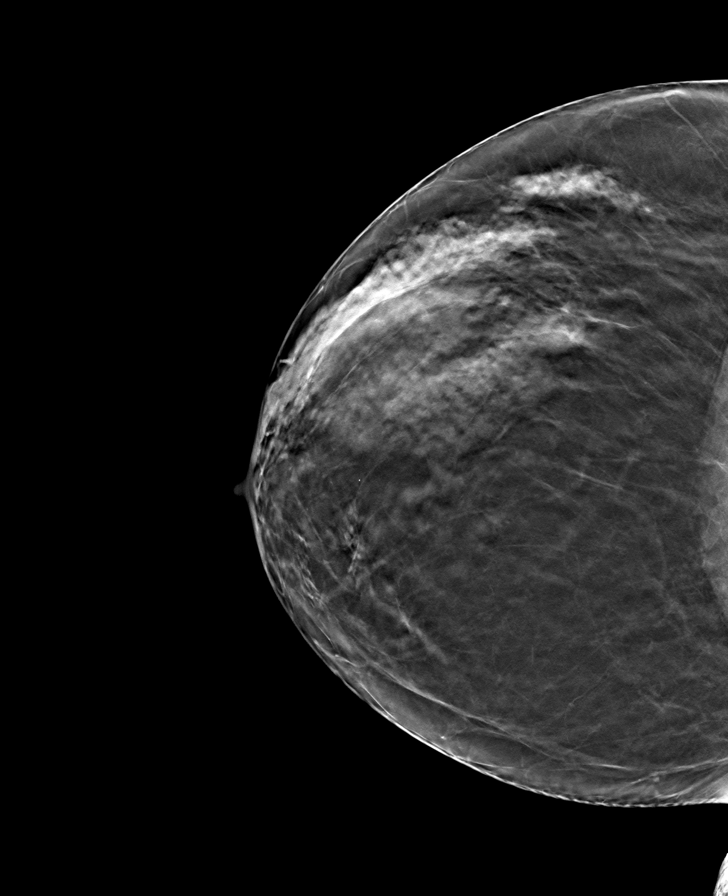

[R MLO tomo · tomo slice 37/74.0]
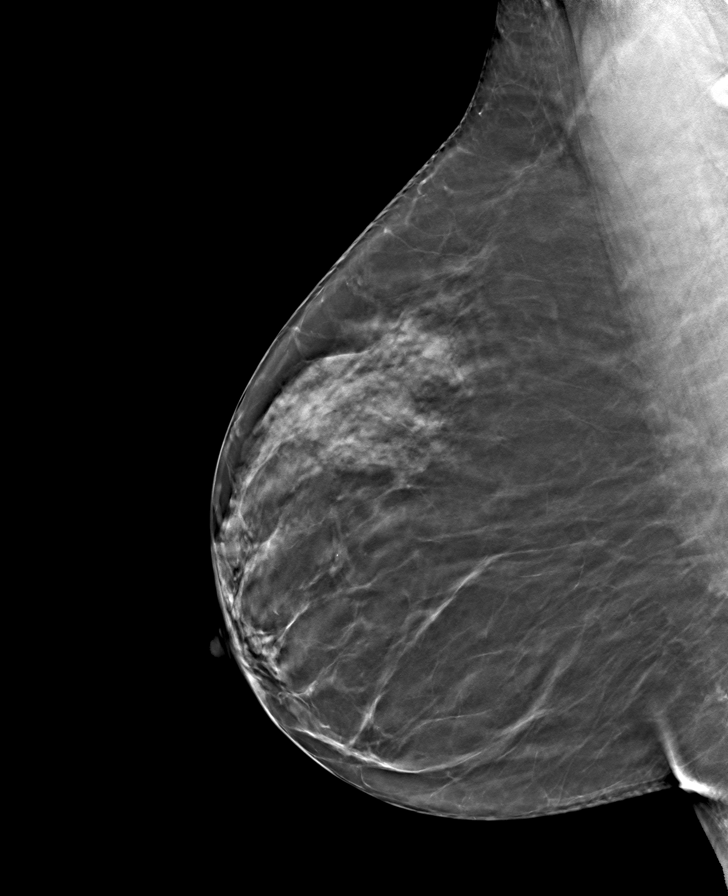

[8 of 24 positions shown; findings below may reference images not displayed]

ACR Breast Density Category b: There are scattered areas of
fibroglandular density.
FINDINGS: There are no findings suspicious for malignancy. Images were
processed with CAD.
IMPRESSION: No mammographic evidence of malignancy. A result letter of this
screening mammogram will be mailed directly to the patient.

RECOMMENDATION:
Screening mammogram in one year. (Code:[id number])

BI-RADS CATEGORY  1: Negative.

## 2022-11-23 ENCOUNTER — Ambulatory Visit
Admission: EM | Admit: 2022-11-23 | Discharge: 2022-11-23 | Disposition: A | Discharge status: Home / Self Care | Payer: Medicaid Other | Attending: Family Medicine | Admitting: Family Medicine

## 2022-11-23 ENCOUNTER — Ambulatory Visit (INDEPENDENT_AMBULATORY_CARE_PROVIDER_SITE_OTHER): Payer: Medicaid Other

## 2022-11-23 DIAGNOSIS — L0211 Cutaneous abscess of neck: Secondary | ICD-10-CM | POA: Diagnosis not present

## 2022-11-23 DIAGNOSIS — R221 Localized swelling, mass and lump, neck: Secondary | ICD-10-CM | POA: Diagnosis present

## 2022-11-23 DIAGNOSIS — R030 Elevated blood-pressure reading, without diagnosis of hypertension: Secondary | ICD-10-CM | POA: Diagnosis not present

## 2022-11-23 LAB — CBC WITH DIFFERENTIAL/PLATELET
Abs Immature Granulocytes: 0.01 10*3/uL (ref 0.00–0.07)
Basophils Absolute: 0 10*3/uL (ref 0.0–0.1)
Basophils Relative: 1 %
Eosinophils Absolute: 0.1 10*3/uL (ref 0.0–0.5)
Eosinophils Relative: 1 %
HCT: 38.9 % (ref 36.0–46.0)
Hemoglobin: 13.1 g/dL (ref 12.0–15.0)
Immature Granulocytes: 0 %
Lymphocytes Relative: 34 %
Lymphs Abs: 2.1 10*3/uL (ref 0.7–4.0)
MCH: 32.3 pg (ref 26.0–34.0)
MCHC: 33.7 g/dL (ref 30.0–36.0)
MCV: 95.8 fL (ref 80.0–100.0)
Monocytes Absolute: 0.4 10*3/uL (ref 0.1–1.0)
Monocytes Relative: 7 %
Neutro Abs: 3.6 10*3/uL (ref 1.7–7.7)
Neutrophils Relative %: 57 %
Platelets: 333 10*3/uL (ref 150–400)
RBC: 4.06 MIL/uL (ref 3.87–5.11)
RDW: 12 % (ref 11.5–15.5)
WBC: 6.2 10*3/uL (ref 4.0–10.5)
nRBC: 0 % (ref 0.0–0.2)

## 2022-11-23 LAB — BASIC METABOLIC PANEL
Anion gap: 8 (ref 5–15)
BUN: 14 mg/dL (ref 6–20)
CO2: 24 mmol/L (ref 22–32)
Calcium: 8.9 mg/dL (ref 8.9–10.3)
Chloride: 105 mmol/L (ref 98–111)
Creatinine, Ser: 0.7 mg/dL (ref 0.44–1.00)
GFR, Estimated: >60 mL/min (ref >60)
Glucose, Bld: 102 mg/dL — ABNORMAL HIGH (ref 70–99)
Potassium: 3.4 mmol/L — ABNORMAL LOW (ref 3.5–5.1)
Sodium: 137 mmol/L (ref 135–145)

## 2022-11-23 MED ORDER — DOXYCYCLINE HYCLATE 100 MG PO CAPS: 100.0000 mg | ORAL_CAPSULE | Freq: Two times a day (BID) | ORAL | 0 refills | Status: DC

## 2022-11-23 MED ORDER — IOHEXOL 300 MG/ML  SOLN
75.0000 mL | Freq: Once | INTRAMUSCULAR | Status: AC | PRN
  Administered 2022-11-23: 75 mL via INTRAVENOUS

## 2022-11-23 NOTE — Discharge Instructions (Signed)
Stop by the pharmacy to pick up your prescriptions.  Follow up with your primary care provider as needed.  

## 2022-11-23 NOTE — ED Notes (Signed)
Called [employer] medicaid to initiate PA for CT soft tissue neck w/contrast. S/w Felton Clinton RN & CT has been approved.   Auth #:[id number] Validation period:9/11-11/10/24

## 2022-11-23 NOTE — ED Triage Notes (Signed)
Abscess on right side of neck. X 1 month. Started out small. Started getting bigger since last Wednesday.

## 2022-11-23 NOTE — ED Provider Notes (Signed)
[facility]    CSN: [id number] Arrival date & time: 11/23/22  0955      History   Chief Complaint Chief Complaint  Patient presents with   Abscess    HPI Beverly Farrell is a 57 y.o. female.   HPI  Beverly Farrell presents for concern for neck abscess that started about a month ago.  This past week it started to get bigger.  Painful to touch.  Tried warm compresses that caused some drainage.  Took some Aleve but it did not help.  Pain worse with compression and touch.  No previous similar symptoms.  No fever, arm pain or radiation.  Has no swelling below the leg area.  She is a smoker.        Past Medical History:  Diagnosis Date   Bursitis    right hip   Wears dentures    partial upper    Patient Active Problem List   Diagnosis Date Noted   Personal history of colonic polyps    History of abnormal mammogram 10/16/2019   Colon polyps 08/13/2014   History of smoking 07/23/2014    Past Surgical History:  Procedure Laterality Date   COLONOSCOPY WITH PROPOFOL N/A 11/08/2019   Procedure: COLONOSCOPY WITH BIOPSY;  Surgeon: Midge Minium, MD;  Location: [facility];  Service: Endoscopy;  Laterality: N/A;  priority 4   polyp removal  2014   removed 3    POLYPECTOMY N/A 11/08/2019   Procedure: POLYPECTOMY;  Surgeon: Midge Minium, MD;  Location: [facility];  Service: Endoscopy;  Laterality: N/A;    OB History     Gravida  0   Para  0   Term  0   Preterm  0   AB  0   Living  0      SAB  0   IAB  0   Ectopic  0   Multiple  0   Live Births  0            Home Medications    Prior to Admission medications   Medication Sig Start Date End Date Taking? Authorizing Provider  doxycycline (VIBRAMYCIN) 100 MG capsule Take 1 capsule (100 mg total) by mouth 2 (two) times daily. 11/23/22  Yes Katha Cabal, DO    Family History Family History  Problem Relation Age of Onset   Hypertension Mother    Diabetes Father     Hyperlipidemia Father    Hypertension Father    Breast cancer Maternal Aunt        mat great aunt   Breast cancer Maternal Grandmother        mat great gm    Social History Social History   Tobacco Use   Smoking status: Former    Current packs/day: 1.00    Average packs/day: 1 pack/day for 1 year (1.0 ttl pk-yrs)    Types: Cigarettes   Smokeless tobacco: Never  Vaping Use   Vaping status: Never Used  Substance Use Topics   Alcohol use: Yes    Comment: occasionally   Drug use: Not Currently    Frequency: 2.0 times per week     Allergies   Patient has no known allergies.   Review of Systems Review of Systems :negative unless otherwise stated in HPI.      Physical Exam Triage Vital Signs ED Triage Vitals  Encounter Vitals Group     BP 11/23/22 1033 (!) 152/90     Systolic BP Percentile --  Diastolic BP Percentile --      Pulse Rate 11/23/22 1033 72     Resp 11/23/22 1033 18     Temp 11/23/22 1033 98.3 F (36.8 C)     Temp Source 11/23/22 1033 Oral     SpO2 11/23/22 1033 100 %     Weight 11/23/22 1032 165 lb (74.8 kg)     Height --      Head Circumference --      Peak Flow --      Pain Score 11/23/22 1032 6     Pain Loc --      Pain Education --      Exclude from Growth Chart --    No data found.  Updated Vital Signs BP (!) 152/90 (BP Location: Left Arm)   Pulse 72   Temp 98.3 F (36.8 C) (Oral)   Resp 18   Wt 74.8 kg   SpO2 100%   BMI 26.63 kg/m   Visual Acuity Right Eye Distance:   Left Eye Distance:   Bilateral Distance:    Right Eye Near:   Left Eye Near:    Bilateral Near:     Physical Exam  GEN: alert, well appearing female, in no acute distress  HENT: Moist mucous membranes, no oropharyngeal lesions, no swelling on the floor the mouth, uvula midline, no nasal discharge EYES: extra occular movements intact, no scleral injection NECK: Anterior right-sided neck mass with anterior neck induration/edema, normal range of motion, no  C-spine neck tenderness, right lymphadenopathy CV: regular rate and rhythm RESP: no increased work of breathing, clear to ascultation bilaterally NEURO: alert, moves all extremities appropriately SKIN: warm and dry;  2.5 x 2.5 cm soft erythematous neck mass, no drainage, central scaly lesion    UC Treatments / Results  Labs (all labs ordered are listed, but only abnormal results are displayed) Labs Reviewed  BASIC METABOLIC PANEL - Abnormal; Notable for the following components:      Result Value   Potassium 3.4 (*)    Glucose, Bld 102 (*)    All other components within normal limits  CBC WITH DIFFERENTIAL/PLATELET    EKG   Radiology CT Soft Tissue Neck W Contrast  Result Date: 11/23/2022 CLINICAL DATA:  Non pulsatile neck mass, area redness and swelling with scab EXAM: CT NECK WITH CONTRAST TECHNIQUE: Multidetector CT imaging of the neck was performed using the standard protocol following the bolus administration of intravenous contrast. RADIATION DOSE REDUCTION: This exam was performed according to the departmental dose-optimization program which includes automated exposure control, adjustment of the mA and/or kV according to patient size and/or use of iterative reconstruction technique. CONTRAST:  75mL OMNIPAQUE IOHEXOL 300 MG/ML  SOLN COMPARISON:  None Available. FINDINGS: Skin thickening in the lower right, with a subcutaneous collection that measures 1.3 x 1.2 x 1.5 cm (AP x TR x CC) (series 2, image 52 and series 17, image 3). Mild surrounding enhancement and adjacent fat stranding. Possible inflammatory changes in the adjacent sternocleidomastoid muscle but no definite evidence of involvement. Pharynx and larynx: Normal. No mass or swelling. Salivary glands: No inflammation, mass, or stone. Thyroid: Heterogeneous nodule with areas of hypoenhancement left thyroid lobe, which measures up to 1.5 cm. Lymph nodes: None enlarged or abnormal density. Vascular: Patent. Limited intracranial:  Negative. Visualized orbits: Negative. Mastoids and visualized paranasal sinuses: Mucosal thickening in the ethmoid air cells. The mastoids are well aerated. Skeleton: No acute or aggressive process. Poor dentition, with periapical lucency about the roots  of the remaining maxillary and mandibular molars. Upper chest: No focal pulmonary opacity or pleural effusion. IMPRESSION: 1. 1.5 cm subcutaneous collection with surrounding inflammatory changes, most likely an abscess. No definite evidence of involvement with the underlying right sternocleidomastoid muscles. 2. 1.5 cm left thyroid nodule. If this has not previously been evaluated, a non-emergent ultrasound of the thyroid is recommended. (Reference: J Am Coll Radiol. 2015 Feb;12(2): 143-50) 3. Poor dentition, with periapical lucency about the roots of the remaining maxillary and mandibular molars. Correlate with dental exam. Electronically Signed   By: Wiliam Ke M.D.   On: 11/23/2022 14:40     Procedures Incision and Drainage  Date/Time: 11/26/2022 10:49 AM  Performed by: Katha Cabal, DO Authorized by: Katha Cabal, DO   Consent:    Consent obtained:  Verbal   Consent given by:  Patient   Risks, benefits, and alternatives were discussed: yes   Universal protocol:    Patient identity confirmed:  Verbally with patient Location:    Type:  Abscess   Size:  2.5 x 2.5 externally   Location:  Neck   Neck location:  R anterior Pre-procedure details:    Skin preparation:  Chlorhexidine with alcohol Sedation:    Sedation type:  None Anesthesia:    Anesthesia method:  Local infiltration   Local anesthetic:  Lidocaine 1% WITH epi Procedure type:    Complexity:  Simple Procedure details:    Incision types:  Single straight   Drainage:  Purulent and bloody   Drainage amount:  Copious   Wound treatment:  Wound left open   Packing materials:  None Post-procedure details:    Procedure completion:  Tolerated  (including critical care  time)  Medications Ordered in UC Medications  iohexol (OMNIPAQUE) 300 MG/ML solution 75 mL (75 mLs Intravenous Contrast Given 11/23/22 1346)    Initial Impression / Assessment and Plan / UC Course  I have reviewed the triage vital signs and the nursing notes.  Pertinent labs & imaging results that were available during my care of the patient were reviewed by me and considered in my medical decision making (see chart for details).     Patient is a 57 y.o. femalewho smokes cigarettes and thyroid issues presents for anterior neck mass.  Overall, patient is well-appearing and well-hydrated.  [place] is afebrile.  She is hypertensive. Pt is not prescribed hypertensive medication.  She notes her BP as been elevated in the past.  She take her blood pressure today and keep a log of this and then take it to her primary care doctor in the next 2 weeks.  BMP today shows normal serum creatinine.  Differential diagnosis includes branchial cyst, lymphadenopathy, infectious lymphadenitis, sternocleidomastoid tumor, dermoid cyst, thyroglossal cyst, thyroid tumor, carotid aneurysm, sebaceous cyst, lipoma, salivary stone  CBC without acute leukocytosis.  Patient requesting drainage.  Explained to patient it would not be safe just to perform an I&D without knowing what structures and or involved in the mass.  Discussed with patient ED versus outpatient CT.  Considered ED evaluation for neck mass workup however we are able to perform CT here today.  Recommended a CT soft tissue of the neck with contrast and patient is agreeable as she would like to avoid the ED.  Performed a peer to peer with her insurance company and they are agreeable to have this performed STAT outpatient.   Personally reviewed CT showing fluid collection in the right anterior neck.  Radiologist notes 1.5 cm subcutaneous collection with surrounding  inflammatory changes most likely an abscess.  There is no sternocleidomastoid involvement.  Of note,  she did have a 1.5 cm left thyroid nodule and patient reports history of this.  Advised to follow-up with her primary care doctor or endocrinologist about this finding.  Given the CT results will proceed with IUD.  I&D performed with copious amounts of purulent drainage from the abscess.  Patient tolerated procedure well.  Prescribed doxycycline twice daily to promote complete resolution.    Reviewed expectations regarding course of current medical issues.  All questions asked were answered.  Outlined signs and symptoms indicating need for more acute intervention. Patient verbalized understanding. After Visit Summary given.  Time based billing statement Performed by: Katha Cabal, DO    Total time: greater than 60 minutes   Time was exclusive of separately billable procedures and treating other patients.   Time spent personally by me on the following activities: reviewing records, development of treatment plan with patient and/or surrogate, discussing need for imaging with insurance/peer-to-peer conversations, evaluation of patient's response to treatment, examination of patient, obtaining history from patient or surrogate, ordering and performing treatments and interventions outside of separately billable procedures, ordering and review of laboratory studies, ordering and review of radiographic studies, counseling patient on hypertension and re-evaluation of patient's condition.   Final Clinical Impressions(s) / UC Diagnoses   Final diagnoses:  Neck mass  Neck abscess  Elevated blood pressure reading without diagnosis of hypertension     Discharge Instructions      Stop by the pharmacy to pick up your prescriptions.  Follow up with your primary care provider as needed.      ED Prescriptions     Medication Sig Dispense Auth. Provider   doxycycline (VIBRAMYCIN) 100 MG capsule Take 1 capsule (100 mg total) by mouth 2 (two) times daily. 20 capsule Katha Cabal, DO       PDMP not reviewed this encounter.              Katha Cabal, DO 11/26/22 1051

## 2022-11-25 ENCOUNTER — Other Ambulatory Visit: Payer: Self-pay

## 2022-11-25 ENCOUNTER — Emergency Department
Admission: EM | Admit: 2022-11-25 | Discharge: 2022-11-25 | Discharge status: Against Medical Advice | Payer: Medicaid Other | Attending: Emergency Medicine | Admitting: Emergency Medicine

## 2022-11-25 ENCOUNTER — Telehealth: Payer: Self-pay

## 2022-11-25 DIAGNOSIS — Z5321 Procedure and treatment not carried out due to patient leaving prior to being seen by health care provider: Secondary | ICD-10-CM | POA: Insufficient documentation

## 2022-11-25 DIAGNOSIS — L500 Allergic urticaria: Secondary | ICD-10-CM | POA: Diagnosis not present

## 2022-11-25 DIAGNOSIS — L0211 Cutaneous abscess of neck: Secondary | ICD-10-CM | POA: Insufficient documentation

## 2022-11-25 DIAGNOSIS — T7840XA Allergy, unspecified, initial encounter: Secondary | ICD-10-CM | POA: Diagnosis present

## 2022-11-25 NOTE — ED Triage Notes (Addendum)
Pt come with c/o allergic reaction the patient states she has CT scan with  contrast Wednesday. Pt states the hives , rash redness, swelling itching started Thursday. Pt had not taken any new meds prior.   Pt states she had abscess to right side of neck. Pt had it drained after  the CT

## 2022-11-26 DIAGNOSIS — L0211 Cutaneous abscess of neck: Secondary | ICD-10-CM | POA: Diagnosis not present

## 2022-11-28 NOTE — Telephone Encounter (Signed)
Error

## 2022-11-30 ENCOUNTER — Telehealth: Payer: Self-pay

## 2022-11-30 NOTE — Transitions of Care (Post Inpatient/ED Visit) (Signed)
11/30/2022  Name: Beverly Farrell MRN: [id number] DOB: Aug 09, 1965  Today's TOC FU Call Status: Today's TOC FU Call Status:: Unsuccessful Call (1st Attempt) Unsuccessful Call (1st Attempt) Date: 11/30/22  Attempted to reach the patient regarding the most recent Inpatient/ED visit.  Follow Up Plan: Additional outreach attempts will be made to reach the patient to complete the Transitions of Care (Post Inpatient/ED visit) call.   Signature

## 2022-12-09 ENCOUNTER — Ambulatory Visit (INDEPENDENT_AMBULATORY_CARE_PROVIDER_SITE_OTHER): Payer: Medicaid Other | Admitting: Family Medicine

## 2022-12-09 ENCOUNTER — Encounter: Payer: Self-pay | Admitting: Family Medicine

## 2022-12-09 VITALS — BP 120/78 | HR 68 | Ht 66.0 in | Wt 164.0 lb

## 2022-12-09 DIAGNOSIS — L270 Generalized skin eruption due to drugs and medicaments taken internally: Secondary | ICD-10-CM | POA: Diagnosis not present

## 2022-12-09 DIAGNOSIS — L0291 Cutaneous abscess, unspecified: Secondary | ICD-10-CM | POA: Diagnosis not present

## 2022-12-09 DIAGNOSIS — Z1231 Encounter for screening mammogram for malignant neoplasm of breast: Secondary | ICD-10-CM

## 2022-12-09 MED ORDER — SULFAMETHOXAZOLE-TRIMETHOPRIM 800-160 MG PO TABS: 1.0000 | ORAL_TABLET | Freq: Two times a day (BID) | ORAL | 0 refills | Status: AC

## 2022-12-09 MED ORDER — MUPIROCIN 2 % EX OINT: 1.0000 | TOPICAL_OINTMENT | Freq: Two times a day (BID) | CUTANEOUS | 0 refills | Status: DC

## 2022-12-09 NOTE — Progress Notes (Signed)
Date:  12/09/2022   Name:  Beverly Farrell   DOB:  1965-08-10   MRN:  [id number]   Chief Complaint: Hospitalization Follow-up (Went to ER for allergic reaction on 9/14 and d/c on same day. 9/18 TOC was placed)  Patient is a 57 year old female who presents for a comprehensive physical exam. The patient reports the following problems: secondry infected cervical area. Health maintenance has been reviewed mammgram      Rash This is a new problem. The problem has been resolved since onset. The rash is diffuse. The rash is characterized by redness and itchiness. She was exposed to a new medication (doxycycline). Pertinent negatives include no anorexia, congestion, cough, facial edema, fever, shortness of breath or sore throat. The treatment provided significant relief.    Lab Results  Component Value Date   NA 137 11/23/2022   K 3.4 (L) 11/23/2022   CO2 24 11/23/2022   GLUCOSE 102 (H) 11/23/2022   BUN 14 11/23/2022   CREATININE 0.70 11/23/2022   CALCIUM 8.9 11/23/2022   GFRNONAA >60 11/23/2022   Lab Results  Component Value Date   CHOL 242 (H) 10/31/2019   HDL 81 10/31/2019   LDLCALC 149 (H) 10/31/2019   TRIG 69 10/31/2019   No results found for: "TSH" No results found for: "HGBA1C" Lab Results  Component Value Date   WBC 6.2 11/23/2022   HGB 13.1 11/23/2022   HCT 38.9 11/23/2022   MCV 95.8 11/23/2022   PLT 333 11/23/2022   No results found for: "ALT", "AST", "GGT", "ALKPHOS", "BILITOT" No results found for: "25OHVITD2", "25OHVITD3", "VD25OH"   Review of Systems  Constitutional:  Negative for chills, diaphoresis and fever.  HENT:  Negative for congestion and sore throat.   Respiratory:  Negative for cough, chest tightness, shortness of breath, wheezing and stridor.   Cardiovascular:  Negative for chest pain, palpitations and leg swelling.  Gastrointestinal:  Negative for anorexia.  Skin:  Positive for rash. Negative for color change, pallor and wound.    Patient  Active Problem List   Diagnosis Date Noted   Personal history of colonic polyps    History of abnormal mammogram 10/16/2019   Colon polyps 08/13/2014   History of smoking 07/23/2014    Allergies  Allergen Reactions   Doxycycline Rash    Past Surgical History:  Procedure Laterality Date   COLONOSCOPY WITH PROPOFOL N/A 11/08/2019   Procedure: COLONOSCOPY WITH BIOPSY;  Surgeon: Midge Minium, MD;  Location: [facility];  Service: Endoscopy;  Laterality: N/A;  priority 4   polyp removal  2014   removed 3    POLYPECTOMY N/A 11/08/2019   Procedure: POLYPECTOMY;  Surgeon: Midge Minium, MD;  Location: [facility];  Service: Endoscopy;  Laterality: N/A;    Social History   Tobacco Use   Smoking status: Former    Current packs/day: 1.00    Average packs/day: 1 pack/day for 1 year (1.0 ttl pk-yrs)    Types: Cigarettes   Smokeless tobacco: Never  Vaping Use   Vaping status: Never Used  Substance Use Topics   Alcohol use: Yes    Comment: occasionally   Drug use: Not Currently    Frequency: 2.0 times per week     Medication list has been reviewed and updated.  No outpatient medications have been marked as taking for the 12/09/22 encounter (Office Visit) with Duanne Limerick, MD.       12/09/2022    2:29 PM 10/03/2019  2:21 PM  GAD 7 : Generalized Anxiety Score  Nervous, Anxious, on Edge 0 0  Control/stop worrying 0 0  Worry too much - different things 0 0  Trouble relaxing 0 0  Restless 0 0  Easily annoyed or irritable 0 0  Afraid - awful might happen 0 0  Total GAD 7 Score 0 0  Anxiety Difficulty Not difficult at all Not difficult at all       12/09/2022    2:29 PM 10/03/2019    2:20 PM  Depression screen PHQ 2/9  Decreased Interest 0 0  Down, Depressed, Hopeless 0 0  PHQ - 2 Score 0 0  Altered sleeping 0 0  Tired, decreased energy 0 0  Change in appetite 0 0  Feeling bad or failure about yourself  0 0  Trouble concentrating 0 0  Moving slowly  or fidgety/restless 0 0  Suicidal thoughts 0 0  PHQ-9 Score 0 0  Difficult doing work/chores Not difficult at all Not difficult at all    BP Readings from Last 3 Encounters:  12/09/22 120/78  11/25/22 (!) 159/81  11/23/22 (!) 152/90    Physical Exam Vitals and nursing note reviewed. Exam conducted with a chaperone present.  Constitutional:      General: She is not in acute distress.    Appearance: She is not diaphoretic.  HENT:     Head: Normocephalic and atraumatic.     Right Ear: External ear normal.     Left Ear: External ear normal.     Nose: Nose normal. No congestion or rhinorrhea.     Mouth/Throat:     Mouth: Mucous membranes are moist.  Eyes:     General:        Right eye: No discharge.        Left eye: No discharge.     Conjunctiva/sclera: Conjunctivae normal.     Pupils: Pupils are equal, round, and reactive to light.  Neck:     Thyroid: No thyromegaly.     Vascular: No JVD.  Cardiovascular:     Rate and Rhythm: Normal rate and regular rhythm.     Heart sounds: Normal heart sounds. No murmur heard.    No friction rub. No gallop.  Pulmonary:     Effort: Pulmonary effort is normal. No respiratory distress.     Breath sounds: No stridor. No wheezing, rhonchi or rales.  Chest:  Breasts:    Right: No swelling, bleeding, inverted nipple, mass, nipple discharge, skin change or tenderness.     Left: No swelling, bleeding, inverted nipple, mass, nipple discharge, skin change or tenderness.  Abdominal:     General: Bowel sounds are normal.     Palpations: Abdomen is soft. There is no mass.     Tenderness: There is no abdominal tenderness. There is no guarding.  Musculoskeletal:        General: Normal range of motion.     Cervical back: Normal range of motion and neck supple.  Lymphadenopathy:     Cervical: No cervical adenopathy.     Right cervical: No superficial, deep or posterior cervical adenopathy.    Left cervical: No superficial, deep or posterior cervical  adenopathy.     Upper Body:     Right upper body: No supraclavicular or axillary adenopathy.     Left upper body: No supraclavicular or axillary adenopathy.  Skin:    General: Skin is warm and dry.  Neurological:     Mental Status: She is  alert.     Deep Tendon Reflexes: Reflexes are normal and symmetric.     Wt Readings from Last 3 Encounters:  12/09/22 164 lb (74.4 kg)  11/23/22 165 lb (74.8 kg)  06/15/20 166 lb (75.3 kg)    BP 120/78   Pulse 68   Ht 5\' 6"  (1.676 m)   Wt 164 lb (74.4 kg)   BMI 26.47 kg/m   Assessment and Plan: 1. Abscess Patient had original abscess that was large on the right cervical area that was initially evaluated by urgent care on 11/25/2022.  Patient was treated with doxycycline after initial drainage.  Because there is a bit of a residual and there was minimal purulence that was expressed we will treat with Bactroban 2% ointment and warm compresses if this does not fully resolve over a 4872 hours patient has been given a refill on her Bactrim DS for a week with caution of any rash or reaction although she has recently tolerated this without issues the previous week. - mupirocin ointment (BACTROBAN) 2 %; Apply 1 Application topically 2 (two) times daily.  Dispense: 22 g; Refill: 0 - sulfamethoxazole-trimethoprim (BACTRIM DS) 800-160 MG tablet; Take 1 tablet by mouth 2 (two) times daily for 10 days.  Dispense: 14 tablet; Refill: 0  2. Allergic drug rash Patient was seen in [facility] ER with a fixed drug reaction presumably from the doxycycline that was initially used for control.  This was discontinued and it was switched to this Bactrim and was given Benadryl for treatment.  Patient has done well since discontinuance and the rash has resolved.  3. Encounter for screening mammogram for malignant neoplasm of breast Discussed with.  And examination was done without palpable mass or abnormality.  Previous mammogram was reviewed and it was normal and we have  referred to [facility] facility for 3D screening mammogram bilateral. - MM 3D SCREENING MAMMOGRAM BILATERAL BREAST     Elizabeth Sauer, MD

## 2023-02-08 ENCOUNTER — Ambulatory Visit
Admission: RE | Admit: 2023-02-08 | Discharge: 2023-02-08 | Disposition: A | Discharge status: Home / Self Care | Payer: Medicaid Other | Source: Ambulatory Visit | Attending: Family Medicine | Admitting: Family Medicine

## 2023-02-08 DIAGNOSIS — Z1231 Encounter for screening mammogram for malignant neoplasm of breast: Secondary | ICD-10-CM | POA: Insufficient documentation

## 2023-04-16 ENCOUNTER — Ambulatory Visit
Admission: EM | Admit: 2023-04-16 | Discharge: 2023-04-16 | Disposition: A | Discharge status: Home / Self Care | Payer: Medicaid Other | Attending: Physician Assistant | Admitting: Physician Assistant

## 2023-04-16 ENCOUNTER — Encounter: Payer: Self-pay | Admitting: Emergency Medicine

## 2023-04-16 DIAGNOSIS — H00011 Hordeolum externum right upper eyelid: Secondary | ICD-10-CM | POA: Diagnosis not present

## 2023-04-16 MED ORDER — ERYTHROMYCIN 5 MG/GM OP OINT: TOPICAL_OINTMENT | OPHTHALMIC | 0 refills | Status: DC

## 2023-04-16 NOTE — Discharge Instructions (Addendum)
-  You have a large stye of your eyelid.  Apply warm compresses every couple of hours.  May also use the ointment.  This will resolve on its own but may take several days. - Ibuprofen and/or Tylenol as needed for pain relief. - Return if acute worsening of symptoms.  Go to ER if associated fever or pain or difficulty when you move your eye.

## 2023-04-16 NOTE — ED Triage Notes (Signed)
Patient c/o right eye pain and drainage that started on Thursday.  Patient denies fevers.

## 2023-04-16 NOTE — ED Provider Notes (Signed)
[facility]    CSN: [id number] Arrival date & time: 04/16/23  0841      History   Chief Complaint Chief Complaint  Patient presents with   Eye Pain    right    HPI Keitha Kolk is a 58 y.o. female presenting for 3-day history of pain, swelling and redness of the right upper eyelid.  Reports crusting and drainage in the inner corner of her eye when she wakes up in the morning.  Fever or pain moving eye.  No pain of the actual eyeball or eyeball redness.  Has infrequently applied warm compresses.  No other treatments.  HPI  Past Medical History:  Diagnosis Date   Bursitis    right hip   Wears dentures    partial upper    Patient Active Problem List   Diagnosis Date Noted   History of colonic polyps    History of abnormal mammogram 10/16/2019   Colon polyps 08/13/2014   History of smoking 07/23/2014    Past Surgical History:  Procedure Laterality Date   COLONOSCOPY WITH PROPOFOL N/A 11/08/2019   Procedure: COLONOSCOPY WITH BIOPSY;  Surgeon: Midge Minium, MD;  Location: [facility];  Service: Endoscopy;  Laterality: N/A;  priority 4   polyp removal  2014   removed 3    POLYPECTOMY N/A 11/08/2019   Procedure: POLYPECTOMY;  Surgeon: Midge Minium, MD;  Location: [facility];  Service: Endoscopy;  Laterality: N/A;    OB History     Gravida  0   Para  0   Term  0   Preterm  0   AB  0   Living  0      SAB  0   IAB  0   Ectopic  0   Multiple  0   Live Births  0            Home Medications    Prior to Admission medications   Medication Sig Start Date End Date Taking? Authorizing Provider  erythromycin ophthalmic ointment Place small amount of ointment into the right eye q6h x 7 days 04/16/23  Yes Shirlee Latch, PA-C    Family History Family History  Problem Relation Age of Onset   Hypertension Mother    Diabetes Father    Hyperlipidemia Father    Hypertension Father    Breast cancer Maternal Aunt        mat  great aunt   Breast cancer Maternal Grandmother        mat great gm    Social History Social History   Tobacco Use   Smoking status: Former    Current packs/day: 1.00    Average packs/day: 1 pack/day for 1 year (1.0 ttl pk-yrs)    Types: Cigarettes   Smokeless tobacco: Never  Vaping Use   Vaping status: Never Used  Substance Use Topics   Alcohol use: Yes    Comment: occasionally   Drug use: Not Currently    Frequency: 2.0 times per week     Allergies   Doxycycline   Review of Systems Review of Systems  Constitutional:  Negative for fatigue and fever.  HENT:  Positive for facial swelling. Negative for congestion.   Eyes:  Positive for discharge. Negative for photophobia, pain, redness and visual disturbance.  Respiratory:  Negative for cough.   Neurological:  Negative for dizziness and headaches.     Physical Exam Triage Vital Signs ED Triage Vitals  Encounter Vitals Group  BP 04/16/23 0928 115/75     Systolic BP Percentile --      Diastolic BP Percentile --      Pulse Rate 04/16/23 0928 67     Resp 04/16/23 0928 14     Temp 04/16/23 0928 98.4 F (36.9 C)     Temp Source 04/16/23 0928 Oral     SpO2 04/16/23 0928 99 %     Weight 04/16/23 0927 160 lb (72.6 kg)     Height 04/16/23 0927 5\' 6"  (1.676 m)     Head Circumference --      Peak Flow --      Pain Score 04/16/23 0927 8     Pain Loc --      Pain Education --      Exclude from Growth Chart --    No data found.  Updated Vital Signs BP 115/75 (BP Location: Left Arm)   Pulse 67   Temp 98.4 F (36.9 C) (Oral)   Resp 14   Ht 5\' 6"  (1.676 m)   Wt 160 lb (72.6 kg)   SpO2 99%   BMI 25.82 kg/m   Visual Acuity Right Eye Distance: 20/40 corrected Left Eye Distance: 20/25 corrected Bilateral Distance: 20/25 corrected     Physical Exam Vitals and nursing note reviewed.  Constitutional:      General: She is not in acute distress.    Appearance: Normal appearance. She is not ill-appearing or  toxic-appearing.  HENT:     Head: Normocephalic and atraumatic.     Nose: Nose normal.  Eyes:     General: Lids are everted, no foreign bodies appreciated. No scleral icterus.       Right eye: Hordeolum present. No discharge.        Left eye: No discharge.     Conjunctiva/sclera: Conjunctivae normal.     Right eye: Right conjunctiva is not injected.     Left eye: Left conjunctiva is not injected.      Comments: Edema and erythema of the entire right upper eyelid.  In the center of the external eyelid there is a small stye which is very tender to palpation.  Cardiovascular:     Rate and Rhythm: Normal rate.  Pulmonary:     Effort: Pulmonary effort is normal. No respiratory distress.  Musculoskeletal:     Cervical back: Neck supple.  Skin:    General: Skin is dry.  Neurological:     General: No focal deficit present.     Mental Status: She is alert. Mental status is at baseline.     Motor: No weakness.     Gait: Gait normal.  Psychiatric:        Mood and Affect: Mood normal.        Behavior: Behavior normal.      UC Treatments / Results  Labs (all labs ordered are listed, but only abnormal results are displayed) Labs Reviewed - No data to display  EKG   Radiology No results found.  Procedures Procedures (including critical care time)  Medications Ordered in UC Medications - No data to display  Initial Impression / Assessment and Plan / UC Course  I have reviewed the triage vital signs and the nursing notes.  Pertinent labs & imaging results that were available during my care of the patient were reviewed by me and considered in my medical decision making (see chart for details).   58 year old female presents for redness, pain and swelling of the right upper eyelid  for the past few days.  Has infrequently  applied warm compresses.  On exam she has a stye of the central external right upper eyelid.  Advised patient of the importance of frequent application of warm  compresses.  Also sent azithromycin abdominal ointment.  Supportive care discussed.  Reviewed return precautions.   Final Clinical Impressions(s) / UC Diagnoses   Final diagnoses:  Hordeolum externum of right upper eyelid     Discharge Instructions      -You have a large stye of your eyelid.  Apply warm compresses every couple of hours.  May also use the ointment.  This will resolve on its own but may take several days. - Ibuprofen and/or Tylenol as needed for pain relief. - Return if acute worsening of symptoms.  Go to ER if associated fever or pain or difficulty when you move your eye.     ED Prescriptions     Medication Sig Dispense Auth. Provider   erythromycin ophthalmic ointment Place small amount of ointment into the right eye q6h x 7 days 3.5 g Shirlee Latch, PA-C      PDMP not reviewed this encounter.   Shirlee Latch, PA-C 04/16/23 [phone]

## 2024-02-11 ENCOUNTER — Ambulatory Visit
Admission: EM | Admit: 2024-02-11 | Discharge: 2024-02-11 | Disposition: A | Discharge status: Home / Self Care | Attending: Emergency Medicine | Admitting: Emergency Medicine

## 2024-02-11 ENCOUNTER — Encounter: Payer: Self-pay | Admitting: Emergency Medicine

## 2024-02-11 DIAGNOSIS — J069 Acute upper respiratory infection, unspecified: Secondary | ICD-10-CM | POA: Diagnosis not present

## 2024-02-11 MED ORDER — AZITHROMYCIN 250 MG PO TABS: 250.0000 mg | ORAL_TABLET | Freq: Every day | ORAL | 0 refills | Status: AC

## 2024-02-11 MED ORDER — PROMETHAZINE-DM 6.25-15 MG/5ML PO SYRP: 5.0000 mL | ORAL_SOLUTION | Freq: Four times a day (QID) | ORAL | 0 refills | Status: AC | PRN

## 2024-02-11 MED ORDER — BENZONATATE 100 MG PO CAPS: 200.0000 mg | ORAL_CAPSULE | Freq: Three times a day (TID) | ORAL | 0 refills | Status: AC

## 2024-02-11 MED ORDER — IPRATROPIUM BROMIDE 0.06 % NA SOLN: 2.0000 | Freq: Four times a day (QID) | NASAL | 12 refills | Status: AC

## 2024-02-11 NOTE — ED Triage Notes (Signed)
 Patient states that she's had a non productive cough for 6 days. Bodyaches started last night. No fever. Patient had been fatigued.

## 2024-02-11 NOTE — Discharge Instructions (Signed)
 Please take the azithromycin once daily for 5 days for treatment of respiratory tract infection.  Use over-the-counter Tylenol and/or ibuprofen as needed for any fever or pain.  Use the Atrovent nasal spray, 2 squirts in each nostril every 6 hours, as needed for runny nose and postnasal drip.  Use the Tessalon Perles every 8 hours during the day.  Take them with a small sip of water .  They may give you some numbness to the base of your tongue or a metallic taste in your mouth, this is normal.  Use the Promethazine DM cough syrup at bedtime for cough and congestion.  It will make you drowsy so do not take it during the day.  Return for reevaluation or see your primary care provider for any new or worsening symptoms.

## 2024-02-11 NOTE — ED Provider Notes (Signed)
 [facility]    CSN: [id number] Arrival date & time: 02/11/24  9156      History   Chief Complaint Chief Complaint  Patient presents with   Cough   Generalized Body Aches   Fatigue    HPI Beverly Farrell is a 58 y.o. female.   HPI  58 year old female with past medical history significant for bursitis presents for evaluation of 60 for the nonproductive cough with bodyaches that started last night and fatigue.  She also reports runny nose and nasal congestion that started this morning.  She has a 67-year-old at home that has been sick.  She denies any fever, ear pain, sore throat, shortness breath, wheezing, or recent travel out of state or country.  Past Medical History:  Diagnosis Date   Bursitis    right hip   Wears dentures    partial upper    Patient Active Problem List   Diagnosis Date Noted   History of colonic polyps    History of abnormal mammogram 10/16/2019   Colon polyps 08/13/2014   History of smoking 07/23/2014    Past Surgical History:  Procedure Laterality Date   COLONOSCOPY WITH PROPOFOL  N/A 11/08/2019   Procedure: COLONOSCOPY WITH BIOPSY;  Surgeon: Jinny Carmine, MD;  Location: [facility];  Service: Endoscopy;  Laterality: N/A;  priority 4   polyp removal  2014   removed 3    POLYPECTOMY N/A 11/08/2019   Procedure: POLYPECTOMY;  Surgeon: Jinny Carmine, MD;  Location: [facility];  Service: Endoscopy;  Laterality: N/A;    OB History     Gravida  0   Para  0   Term  0   Preterm  0   AB  0   Living  0      SAB  0   IAB  0   Ectopic  0   Multiple  0   Live Births  0            Home Medications    Prior to Admission medications   Medication Sig Start Date End Date Taking? Authorizing Provider  azithromycin (ZITHROMAX Z-PAK) 250 MG tablet Take 1 tablet (250 mg total) by mouth daily. Take 2 tablets on the first day and then 1 tablet daily thereafter for a total of 5 days of treatment. 02/11/24  Yes  Bernardino Ditch, NP  benzonatate (TESSALON) 100 MG capsule Take 2 capsules (200 mg total) by mouth every 8 (eight) hours. 02/11/24  Yes Bernardino Ditch, NP  ipratropium (ATROVENT) 0.06 % nasal spray Place 2 sprays into both nostrils 4 (four) times daily. 02/11/24  Yes Bernardino Ditch, NP  promethazine-dextromethorphan (PROMETHAZINE-DM) 6.25-15 MG/5ML syrup Take 5 mLs by mouth 4 (four) times daily as needed. 02/11/24  Yes Bernardino Ditch, NP    Family History Family History  Problem Relation Age of Onset   Hypertension Mother    Diabetes Father    Hyperlipidemia Father    Hypertension Father    Breast cancer Maternal Aunt        mat great aunt   Breast cancer Maternal Grandmother        mat great gm    Social History Social History   Tobacco Use   Smoking status: Former    Current packs/day: 1.00    Average packs/day: 1 pack/day for 1 year (1.0 ttl pk-yrs)    Types: Cigarettes   Smokeless tobacco: Never  Vaping Use   Vaping status: Never Used  Substance Use Topics  Alcohol use: Yes    Comment: occasionally   Drug use: Not Currently    Frequency: 2.0 times per week     Allergies   Doxycycline    Review of Systems Review of Systems  Constitutional:  Negative for fever.  HENT:  Positive for congestion, postnasal drip and rhinorrhea. Negative for ear pain and sore throat.   Respiratory:  Positive for cough. Negative for shortness of breath and wheezing.      Physical Exam Triage Vital Signs ED Triage Vitals  Encounter Vitals Group     BP      Girls Systolic BP Percentile      Girls Diastolic BP Percentile      Boys Systolic BP Percentile      Boys Diastolic BP Percentile      Pulse      Resp      Temp      Temp src      SpO2      Weight      Height      Head Circumference      Peak Flow      Pain Score      Pain Loc      Pain Education      Exclude from Growth Chart    No data found.  Updated Vital Signs BP (!) 163/94 (BP Location: Right Arm)   Pulse 69    Temp 98.2 F (36.8 C) (Oral)   Resp 18   Wt 158 lb (71.7 kg)   SpO2 95%   BMI 25.50 kg/m   Visual Acuity Right Eye Distance:   Left Eye Distance:   Bilateral Distance:    Right Eye Near:   Left Eye Near:    Bilateral Near:     Physical Exam Vitals and nursing note reviewed.  Constitutional:      Appearance: Normal appearance. She is not ill-appearing.  HENT:     Head: Normocephalic and atraumatic.     Right Ear: Tympanic membrane, ear canal and external ear normal. There is no impacted cerumen.     Left Ear: Tympanic membrane, ear canal and external ear normal. There is no impacted cerumen.     Nose: Congestion and rhinorrhea present.     Comments: Nasal mucosa is edematous and erythematous with clear discharge in both nares.    Mouth/Throat:     Mouth: Mucous membranes are moist.     Pharynx: Oropharynx is clear. Posterior oropharyngeal erythema present. No oropharyngeal exudate.     Comments: Erythema to the posterior pharynx with clear postnasal drip. Cardiovascular:     Rate and Rhythm: Normal rate and regular rhythm.     Pulses: Normal pulses.     Heart sounds: Normal heart sounds. No murmur heard.    No friction rub. No gallop.  Pulmonary:     Effort: Pulmonary effort is normal.     Breath sounds: Normal breath sounds. No wheezing, rhonchi or rales.  Musculoskeletal:     Cervical back: Normal range of motion and neck supple. No tenderness.  Lymphadenopathy:     Cervical: No cervical adenopathy.  Skin:    General: Skin is warm and dry.     Capillary Refill: Capillary refill takes less than 2 seconds.     Findings: No erythema or rash.  Neurological:     General: No focal deficit present.     Mental Status: She is alert and oriented to person, place, and time.  UC Treatments / Results  Labs (all labs ordered are listed, but only abnormal results are displayed) Labs Reviewed - No data to display  EKG   Radiology No results  found.  Procedures Procedures (including critical care time)  Medications Ordered in UC Medications - No data to display  Initial Impression / Assessment and Plan / UC Course  I have reviewed the triage vital signs and the nursing notes.  Pertinent labs & imaging results that were available during my care of the patient were reviewed by me and considered in my medical decision making (see chart for details).   Patient is a pleasant, nontoxic-appearing 58 year old female presenting for evaluation of respiratory symptoms that been going on for the last 6 days and worsening over the last 2 was outlined in HPI above.  In the exam room she is able to speak in full sentence without dyspnea or tachypnea.  Respiratory rate at triage was 18 with a 95% room air oxygen saturation.  Her physical exam does reveal inflammation of her upper respiratory tract as evidenced by inflamed nasal mucosa with clear nasal discharge.  Erythema to the posterior pharynx with clear postnasal drip as well.  Cardiopulmonary exam reveals clear lung sounds in all fields.  The patient has a history of smoking but she is not currently a smoker.  For this reason I do feel that a trial of antibiotics is warranted.  I will discharge her home on azithromycin once daily for 5 days for treatment of her URI along with Atrovent nasal spray, Tessalon Perles, and Promethazine DM cough syrup.  Return precautions reviewed.   Final Clinical Impressions(s) / UC Diagnoses   Final diagnoses:  URI with cough and congestion     Discharge Instructions      Please take the azithromycin once daily for 5 days for treatment of respiratory tract infection.  Use over-the-counter Tylenol and/or ibuprofen as needed for any fever or pain.  Use the Atrovent nasal spray, 2 squirts in each nostril every 6 hours, as needed for runny nose and postnasal drip.  Use the Tessalon Perles every 8 hours during the day.  Take them with a small sip of water .   They may give you some numbness to the base of your tongue or a metallic taste in your mouth, this is normal.  Use the Promethazine DM cough syrup at bedtime for cough and congestion.  It will make you drowsy so do not take it during the day.  Return for reevaluation or see your primary care provider for any new or worsening symptoms.      ED Prescriptions     Medication Sig Dispense Auth. Provider   azithromycin (ZITHROMAX Z-PAK) 250 MG tablet Take 1 tablet (250 mg total) by mouth daily. Take 2 tablets on the first day and then 1 tablet daily thereafter for a total of 5 days of treatment. 6 tablet Bernardino Ditch, NP   benzonatate (TESSALON) 100 MG capsule Take 2 capsules (200 mg total) by mouth every 8 (eight) hours. 21 capsule Bernardino Ditch, NP   ipratropium (ATROVENT) 0.06 % nasal spray Place 2 sprays into both nostrils 4 (four) times daily. 15 mL Bernardino Ditch, NP   promethazine-dextromethorphan (PROMETHAZINE-DM) 6.25-15 MG/5ML syrup Take 5 mLs by mouth 4 (four) times daily as needed. 118 mL Bernardino Ditch, NP      PDMP not reviewed this encounter.   Bernardino Ditch, NP 02/11/24 [phone]
